# Patient Record
Sex: Male | Born: 1990 | Race: White | Hispanic: No | Marital: Single | Smoking: Current some day smoker
Health system: Southern US, Community
[De-identification: ages and names within clinical notes are randomized; demographics above are authoritative.]

## PROBLEM LIST (undated history)

## (undated) HISTORY — PX: TONSILLECTOMY: SUR1361

---
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
## 2011-02-10 LAB — POCT I-STAT, CHEM 8
Calcium, Ion: 1.09 — ABNORMAL LOW
Glucose, Bld: 114 — ABNORMAL HIGH
HCT: 39
Hemoglobin: 13.3

## 2011-02-10 LAB — CBC
RBC: 4.66
WBC: 7.9

## 2011-02-10 LAB — COMPREHENSIVE METABOLIC PANEL
ALT: 15
AST: 18
CO2: 24
Chloride: 107
Sodium: 138
Total Bilirubin: 0.6

## 2011-02-10 LAB — RAPID URINE DRUG SCREEN, HOSP PERFORMED
Cocaine: NOT DETECTED
Tetrahydrocannabinol: NOT DETECTED

## 2011-02-10 LAB — ETHANOL: Alcohol, Ethyl (B): <5

## 2011-02-10 LAB — ACETAMINOPHEN LEVEL: Acetaminophen (Tylenol), Serum: <10 — ABNORMAL LOW

## 2011-05-06 ENCOUNTER — Encounter: Payer: Self-pay | Admitting: *Deleted

## 2011-05-06 ENCOUNTER — Emergency Department (HOSPITAL_COMMUNITY)
Admission: EM | Admit: 2011-05-06 | Discharge: 2011-05-06 | Disposition: A | Discharge status: Home / Self Care | Payer: Federal, State, Local not specified - PPO | Attending: Emergency Medicine | Admitting: Emergency Medicine

## 2011-05-06 DIAGNOSIS — F329 Major depressive disorder, single episode, unspecified: Secondary | ICD-10-CM | POA: Insufficient documentation

## 2011-05-06 DIAGNOSIS — IMO0002 Reserved for concepts with insufficient information to code with codable children: Secondary | ICD-10-CM | POA: Insufficient documentation

## 2011-05-06 DIAGNOSIS — F1910 Other psychoactive substance abuse, uncomplicated: Secondary | ICD-10-CM | POA: Insufficient documentation

## 2011-05-06 DIAGNOSIS — F3289 Other specified depressive episodes: Secondary | ICD-10-CM | POA: Insufficient documentation

## 2011-05-06 DIAGNOSIS — R45851 Suicidal ideations: Secondary | ICD-10-CM | POA: Insufficient documentation

## 2011-05-06 DIAGNOSIS — R443 Hallucinations, unspecified: Secondary | ICD-10-CM | POA: Insufficient documentation

## 2011-05-06 LAB — CBC
HCT: 40.5 % (ref 39.0–52.0)
RDW: 12.8 % (ref 11.5–15.5)
WBC: 8.1 10*3/uL (ref 4.0–10.5)

## 2011-05-06 LAB — URINALYSIS, ROUTINE W REFLEX MICROSCOPIC
Bilirubin Urine: NEGATIVE
Ketones, ur: NEGATIVE mg/dL
Nitrite: NEGATIVE
Urobilinogen, UA: 0.2 mg/dL (ref 0.0–1.0)
pH: 5.5 (ref 5.0–8.0)

## 2011-05-06 LAB — RAPID URINE DRUG SCREEN, HOSP PERFORMED
Barbiturates: NOT DETECTED
Benzodiazepines: NOT DETECTED
Tetrahydrocannabinol: POSITIVE — AB

## 2011-05-06 LAB — COMPREHENSIVE METABOLIC PANEL
ALT: 13 U/L (ref 0–53)
AST: 15 U/L (ref 0–37)
Albumin: 4.1 g/dL (ref 3.5–5.2)
Alkaline Phosphatase: 65 U/L (ref 39–117)
BUN: 7 mg/dL (ref 6–23)
Chloride: 105 mEq/L (ref 96–112)
Potassium: 4 mEq/L (ref 3.5–5.1)
Sodium: 136 mEq/L (ref 135–145)
Total Bilirubin: 0.2 mg/dL — ABNORMAL LOW (ref 0.3–1.2)

## 2011-05-06 LAB — ETHANOL: Alcohol, Ethyl (B): <11 mg/dL (ref 0–11)

## 2011-05-06 MED ORDER — LORAZEPAM 1 MG PO TABS: 1.0000 mg | ORAL_TABLET | Freq: Three times a day (TID) | ORAL | Status: DC | PRN

## 2011-05-06 MED ORDER — ONDANSETRON HCL 8 MG PO TABS: 4.0000 mg | ORAL_TABLET | Freq: Three times a day (TID) | ORAL | Status: DC | PRN

## 2011-05-06 MED ORDER — ZOLPIDEM TARTRATE 5 MG PO TABS: 5.0000 mg | ORAL_TABLET | Freq: Every evening | ORAL | Status: DC | PRN

## 2011-05-06 MED ORDER — ALUM & MAG HYDROXIDE-SIMETH 200-200-20 MG/5ML PO SUSP: 30.0000 mL | ORAL | Status: DC | PRN

## 2011-05-06 MED ORDER — IBUPROFEN 200 MG PO TABS: 600.0000 mg | ORAL_TABLET | Freq: Three times a day (TID) | ORAL | Status: DC | PRN

## 2011-05-06 MED ORDER — ACETAMINOPHEN 325 MG PO TABS: 650.0000 mg | ORAL_TABLET | ORAL | Status: DC | PRN

## 2011-05-06 NOTE — ED Notes (Signed)
Per mom, " patient is not happy, not worth living, patient put a rope on the tree."  Per patient, " he denies any thoughts of suicide or attempt."  Patient states that his mom kicked him out a week ago and he has not had a place to stay.  Patient states, " I am upset because mom kicked me out and I have no where to go."

## 2011-05-06 NOTE — ED Notes (Signed)
Pt denies suicidal ideation. Mother and pt seem to have home issues. Pt states his mother made him move out recently. Mother is here at bedside and seems to make pt anxious. ACT team at bedside.

## 2011-05-06 NOTE — BH Assessment (Signed)
Assessment Note   Leonard Day is an 20 y.o. male. Pt presents to [facility] with mother and little sister at bedside. Patient himself to the ED today after promising his mother he would get a evaluation for "drugs and mental issues". Pt's mother is adamant that her son is suicidal. She explains that last week he hung a rope from a tree in her back yard. She further explains that he sent her "suicidal related" text messages 3-4 days ago. Pt denies suicidal ideations today. He has no plan, intent, and/or means.. He sts, "I am only here because my mother kicked me out ..I refused to go to a rehab program..so what if I smoke a little K2 and THC". Pt has no history of suicide attempts. He does admit to thoughts in the past but sts, "I am to chicken to hurt myself". Pt is able to contract for safety. Patient denies HI and AVH. He admits to regular use of K2 and THC.   Mother expresses her concern for patients anger issues, drug use, verbal abuse toward her, and lack of respect for her home. Writer informed pt's mother that patient could not be held for behaviors listed above. She was also aware that patient could not be committed for suicidal comments or gestures that happened days ago. Mom expressed her understanding for the terms of when and how a person could be held under commitment.   Patient offered but refused referrals. Pt was offered therapist and psychiatrist. Writer also discussed with patient information about various substance abuse programs; patient denied wanting substance abuse treatment at any capacity.  Discussed case with EDP; he agreed to discharge patient home due to no criteria for commitment. The EDP also discussed this information with patients mother.   Melynda Ripple, MS (Assessment Counselor)  Axis I: Mood Disorder NOS, Cannabis Dependence, other substance (unk) abuse Axis II: Deferred Axis III: History reviewed. No pertinent past medical history. Axis IV: economic problems,  educational problems, housing problems, occupational problems, other psychosocial or environmental problems, problems related to legal system/crime, problems related to social environment, problems with access to health care services and problems with primary support group Axis V: 61-70 mild symptoms  Past Medical History: History reviewed. No pertinent past medical history.  Past Surgical History  Procedure Date  . Vascular surgery     Family History: History reviewed. No pertinent family history.  Social History:  reports that he has been smoking Cigarettes.  He does not have any smokeless tobacco history on file. He reports that he drinks alcohol. He reports that he uses illicit drugs (Other-see comments, Marijuana, and Ketamine).  Additional Social History:  Alcohol / Drug Use Pain Medications: patient denies Prescriptions: patient denies Over the Counter: patient denies Allergies: No Known Allergies  Home Medications:  Medications Prior to Admission  Medication Dose Route Frequency Provider Last Rate Last Dose  . acetaminophen (TYLENOL) tablet 650 mg  650 mg Oral Q4H PRN Dayton Bailiff, MD      . alum & mag hydroxide-simeth (MAALOX/MYLANTA) 200-200-20 MG/5ML suspension 30 mL  30 mL Oral PRN Dayton Bailiff, MD      . ibuprofen (ADVIL,MOTRIN) tablet 600 mg  600 mg Oral Q8H PRN Dayton Bailiff, MD      . LORazepam (ATIVAN) tablet 1 mg  1 mg Oral Q8H PRN Dayton Bailiff, MD      . ondansetron [facility]) tablet 4 mg  4 mg Oral Q8H PRN Dayton Bailiff, MD      . zolpidem (  AMBIEN) tablet 5 mg  5 mg Oral QHS PRN Dayton Bailiff, MD       No current outpatient prescriptions on file as of 05/06/2011.    OB/GYN Status:  No LMP for male patient.  General Assessment Data Location of Assessment: [facility] ED ACT Assessment: Yes Living Arrangements: Other (Comment) (kicked out of home 1wk ago; normally dad,sister, mom) Can pt return to current living arrangement?: No (kicked out of home 1 week ago) Admission Status:  Voluntary Is patient capable of signing voluntary admission?: Yes Transfer from: Acute Hospital Referral Source: Self/Family/Friend  Education Status Is patient currently in school?: No (sts he will be in school in the Spring)  Risk to self Suicidal Ideation: No Suicidal Intent: No Is patient at risk for suicide?: No Suicidal Plan?: No Access to Means: No What has been your use of drugs/alcohol within the last 12 months?:  (K2, THC,occas. alcohol use) Previous Attempts/Gestures: No (pt reports thoughts only no attempts/gestures) How many times?:  (n/a) Other Self Harm Risks:  (n/a) Triggers for Past Attempts:  (thoughts only: thoughts triggered by homelessness) Intentional Self Injurious Behavior: None Family Suicide History: No Recent stressful life event(s): Turmoil (Comment);Other (Comment);Conflict (Comment) (kicked out of parents home 1wk ago, no money, no clothes,etc) Persecutory voices/beliefs?: No Depression: No Depression Symptoms:  (none reported) Substance abuse history and/or treatment for substance abuse?:  (no history of treatment) Suicide prevention information given to non-admitted patients: Not applicable  Risk to Others Homicidal Ideation: No Thoughts of Harm to Others: No Current Homicidal Intent: No Current Homicidal Plan: No Access to Homicidal Means: No Identified Victim:  (n/a) History of harm to others?: No Assessment of Violence: None Noted (calm and cooperative) Violent Behavior Description:  (no history of violent behaviors) Does patient have access to weapons?: No Criminal Charges Pending?: Yes Describe Pending Criminal Charges:  (currently on probation felony possesion charge) Court Date:  (pt sts that he has court 3x's this month)  Psychosis Hallucinations: None noted Delusions: None noted  Mental Status Report Appear/Hygiene: Other (Comment);Disheveled Eye Contact: Good Motor Activity: Unremarkable Speech:  (normal) Level of  Consciousness: Alert Mood: Anxious (pt anxious to leave; doesn't think he needs to be here) Affect: Irritable (appropriate) Anxiety Level: None Judgement: Unimpaired Orientation: Person;Place;Time;Situation Obsessive Compulsive Thoughts/Behaviors: None  Cognitive Functioning Concentration: Normal Memory: Recent Intact;Remote Intact IQ: Average Insight: Good Impulse Control: Good Appetite: Poor Weight Loss:  (pt denies) Weight Gain:  (pt denies) Sleep: No Change Total Hours of Sleep:  (varies..pt sts he tries to get at least 10 hrs per night) Vegetative Symptoms: None  Prior Inpatient Therapy Prior Inpatient Therapy: No Prior Therapy Dates:  (n/a) Prior Therapy Facilty/Provider(s):  (n/a) Reason for Treatment:  (n/a)  Prior Outpatient Therapy Prior Outpatient Therapy: No Prior Therapy Dates:  (n/a) Prior Therapy Facilty/Provider(s):  (n/a) Reason for Treatment:  (n/a)  ADL Screening (condition at time of admission) Patient's cognitive ability adequate to safely complete daily activities?: Yes Patient able to express need for assistance with ADLs?: Yes Independently performs ADLs?: Yes Weakness of Legs: None Weakness of Arms/Hands: None  Home Assistive Devices/Equipment Home Assistive Devices/Equipment: None    Abuse/Neglect Assessment (Assessment to be complete while patient is alone) Physical Abuse: Denies Verbal Abuse: Denies Sexual Abuse: Denies Exploitation of patient/patient's resources: Denies Values / Beliefs Cultural Requests During Hospitalization: None Spiritual Requests During Hospitalization: None     Nutrition Screen Diet: Regular  Additional Information 1:1 In Past 12 Months?: No CIRT Risk: No Elopement Risk: No Does  patient have medical clearance?: Yes     Disposition:  Pt offered and refused all treatment. Pt refused to take any referrals stating, "I just want to get out of here"   On Site Evaluation by:   Reviewed with Physician:       Melynda Ripple [facility] 05/06/2011 10:01 PM

## 2011-05-06 NOTE — ED Provider Notes (Signed)
History     CSN: [id number]  Arrival date & time 05/06/11  1732   First MD Initiated Contact with Patient 05/06/11 1907      Chief Complaint  Patient presents with  . Suicidal    (Consider location/radiation/quality/duration/timing/severity/associated sxs/prior treatment) HPI Comments: Per mom, " patient is not happy, not worth living, patient put a rope on the tree."  Per patient, " he denies any thoughts of suicide or attempt."  Patient states that his mom kicked him out a week ago and he has not had a place to stay.  Patient states, " I am upset because mom kicked me out and I have no where to go."  Patient is a 20 y.o. male presenting with mental health disorder. The history is provided by the patient. No language interpreter was used.  Mental Health Problem The primary symptoms do not include dysphoric mood, delusions or hallucinations.  The onset of the illness is precipitated by a stressful event and drug abuse. The degree of incapacity that he is experiencing as a consequence of his illness is moderate. Additional symptoms of the illness include agitation. Additional symptoms of the illness do not include no anhedonia, no insomnia, no appetite change, no fatigue, no headaches or no abdominal pain. He admits to suicidal ideas. He does have a plan to commit suicide. He contemplates harming himself. He has not already injured self. He does not contemplate injuring another person. He has not already  injured another person.    History reviewed. No pertinent past medical history.  Past Surgical History  Procedure Date  . Vascular surgery     History reviewed. No pertinent family history.  History  Substance Use Topics  . Smoking status: Current Some Day Smoker  . Smokeless tobacco: Not on file  . Alcohol Use: Yes      Review of Systems  Constitutional: Negative for fever, activity change, appetite change and fatigue.  HENT: Negative for congestion, sore throat,  rhinorrhea, neck pain and neck stiffness.   Respiratory: Negative for cough and shortness of breath.   Cardiovascular: Negative for chest pain and palpitations.  Gastrointestinal: Negative for nausea, vomiting and abdominal pain.  Genitourinary: Negative for dysuria, urgency, frequency and flank pain.  Neurological: Negative for dizziness, weakness, light-headedness, numbness and headaches.  Psychiatric/Behavioral: Positive for suicidal ideas, behavioral problems and agitation. Negative for hallucinations and dysphoric mood. The patient is not nervous/anxious and does not have insomnia.   All other systems reviewed and are negative.    Allergies  Review of patient's allergies indicates no known allergies.  Home Medications  No current outpatient prescriptions on file.  BP 159/87  Pulse 97  Temp(Src) 98.3 F (36.8 C) (Oral)  Resp 16  SpO2 98%  Physical Exam  Nursing note and vitals reviewed. Constitutional: He is oriented to person, place, and time. He appears well-developed and well-nourished. No distress.  HENT:  Head: Normocephalic and atraumatic.  Mouth/Throat: Oropharynx is clear and moist. No oropharyngeal exudate.  Eyes: Conjunctivae and EOM are normal. Pupils are equal, round, and reactive to light.  Neck: Normal range of motion. Neck supple.  Cardiovascular: Normal rate, regular rhythm, normal heart sounds and intact distal pulses.  Exam reveals no gallop and no friction rub.   No murmur heard. Pulmonary/Chest: Effort normal and breath sounds normal. No respiratory distress.  Abdominal: Soft. Bowel sounds are normal. There is no tenderness.  Musculoskeletal: Normal range of motion. He exhibits no tenderness.  Neurological: He is alert and oriented  to person, place, and time. No cranial nerve deficit.  Skin: Skin is warm and dry. No rash noted.  Psychiatric: He is agitated and withdrawn. He exhibits a depressed mood. He expresses suicidal ideation. He expresses no  homicidal ideation.    ED Course  Procedures (including critical care time)  Labs Reviewed  URINE RAPID DRUG SCREEN (HOSP PERFORMED) - Abnormal; Notable for the following:    Tetrahydrocannabinol POSITIVE (*)    All other components within normal limits  CBC  URINALYSIS, ROUTINE W REFLEX MICROSCOPIC  COMPREHENSIVE METABOLIC PANEL  ETHANOL   No results found.   No diagnosis found.    MDM  Depression and increased agitataion. Made suicide gestures and has been using illicit drugs.  Medical screening labs and ACT team consult.  Psych holding orders placed.  Will dispo per psych recs.        Dayton Bailiff, MD 05/06/11 2002

## 2013-11-06 ENCOUNTER — Emergency Department (HOSPITAL_COMMUNITY)
Admission: EM | Admit: 2013-11-06 | Discharge: 2013-11-06 | Disposition: A | Discharge status: Home / Self Care | Payer: Federal, State, Local not specified - PPO | Source: Home / Self Care | Attending: Family Medicine | Admitting: Family Medicine

## 2013-11-06 ENCOUNTER — Encounter (HOSPITAL_COMMUNITY): Payer: Self-pay | Admitting: Emergency Medicine

## 2013-11-06 DIAGNOSIS — J358 Other chronic diseases of tonsils and adenoids: Secondary | ICD-10-CM

## 2013-11-06 DIAGNOSIS — R220 Localized swelling, mass and lump, head: Secondary | ICD-10-CM

## 2013-11-06 DIAGNOSIS — J029 Acute pharyngitis, unspecified: Secondary | ICD-10-CM

## 2013-11-06 DIAGNOSIS — R221 Localized swelling, mass and lump, neck: Secondary | ICD-10-CM

## 2013-11-06 LAB — POCT RAPID STREP A: STREPTOCOCCUS, GROUP A SCREEN (DIRECT): NEGATIVE

## 2013-11-06 MED ORDER — FLUTICASONE PROPIONATE 50 MCG/ACT NA SUSP: 2.0000 | Freq: Every day | NASAL | Status: DC

## 2013-11-06 NOTE — Discharge Instructions (Signed)
Thank you for coming in today. [facility]  Let me know if you are having a problem being seen over there.  Call or go to the emergency room if you get worse, have trouble breathing, have chest pains, or palpitations.   Tonsillitis Tonsillitis is an infection of the throat that causes the tonsils to become red, tender, and swollen. Tonsils are collections of lymphoid tissue at the back of the throat. Each tonsil has crevices (crypts). Tonsils help fight nose and throat infections and keep infection from spreading to other parts of the body for the first 18 months of life.  CAUSES Sudden (acute) tonsillitis is usually caused by infection with streptococcal bacteria. Long-lasting (chronic) tonsillitis occurs when the crypts of the tonsils become filled with pieces of food and bacteria, which makes it easy for the tonsils to become repeatedly infected. SYMPTOMS  Symptoms of tonsillitis include:  A sore throat, with possible difficulty swallowing.  White patches on the tonsils.  Fever.  Tiredness.  New episodes of snoring during sleep, when you did not snore before.  Small, foul-smelling, yellowish-white pieces of material (tonsilloliths) that you occasionally cough up or spit out. The tonsilloliths can also cause you to have bad breath. DIAGNOSIS Tonsillitis can be diagnosed through a physical exam. Diagnosis can be confirmed with the results of lab tests, including a throat culture. TREATMENT  The goals of tonsillitis treatment include the reduction of the severity and duration of symptoms and prevention of associated conditions. Symptoms of tonsillitis can be improved with the use of steroids to reduce the swelling. Tonsillitis caused by bacteria can be treated with antibiotic medicines. Usually, treatment with antibiotic medicines is started before the cause of the tonsillitis is known. However, if it is determined that the cause is not bacterial, antibiotic medicines  will not treat the tonsillitis. If attacks of tonsillitis are severe and frequent, your health care provider may recommend surgery to remove the tonsils (tonsillectomy). HOME CARE INSTRUCTIONS   Rest as much as possible and get plenty of sleep.  Drink plenty of fluids. While the throat is very sore, eat soft foods or liquids, such as sherbet, soups, or instant breakfast drinks.  Eat frozen ice pops.  Gargle with a warm or cold liquid to help soothe the throat. Mix 1/4 teaspoon of salt and 1/4 teaspoon of baking soda in in 8 oz of water. SEEK MEDICAL CARE IF:   Large, tender lumps develop in your neck.  A rash develops.  A green, yellow-brown, or bloody substance is coughed up.  You are unable to swallow liquids or food for 24 hours.  You notice that only one of the tonsils is swollen. SEEK IMMEDIATE MEDICAL CARE IF:   You develop any new symptoms such as vomiting, severe headache, stiff neck, chest pain, or trouble breathing or swallowing.  You have severe throat pain along with drooling or voice changes.  You have severe pain, unrelieved with recommended medications.  You are unable to fully open the mouth.  You develop redness, swelling, or severe pain anywhere in the neck.  You have a fever. MAKE SURE YOU:   Understand these instructions.  Will watch your condition.  Will get help right away if you are not doing well or get worse. Document Released: 02/08/2005 Document Revised: 05/06/2013 Document Reviewed: 10/18/2012 [facility] Patient Information 2015 [place] [place]. This information is not intended to replace advice given to you by your health care provider. Make sure you discuss any questions you have  with your health care provider. ° °

## 2013-11-06 NOTE — ED Notes (Signed)
C/o sore throat  States [facility] States it is hard to swallow States girlfriend seen something inside his throat but unaware of what it is

## 2013-11-06 NOTE — ED Provider Notes (Signed)
Leonard Day Dowell is a 23 y.o. male who presents to Urgent Care today for tonsillar mass. Patient notes a mass on his right tonsil is been present for several months. He notes an itchy sensation to his throat. He notes that he snores loudly. He denies any fevers or chills nausea vomiting or diarrhea. No night sweats or weight loss.   History reviewed. No pertinent past medical history. History  Substance Use Topics  . Smoking status: Current Some Day Smoker    Types: Cigarettes  . Smokeless tobacco: Not on file  . Alcohol Use: Yes   ROS as above Medications: No current facility-administered medications for this encounter.   Current Outpatient Prescriptions  Medication Sig Dispense Refill  . fluticasone (FLONASE) 50 MCG/ACT nasal spray Place 2 sprays into both nostrils daily.  16 g  2    Exam:  BP 142/61  Pulse 82  Temp(Src) 98 F (36.7 C) (Oral)  Resp 14  SpO2 100% Gen: Well NAD HEENT: EOMI,  MMM papular less than 1 cm mass on the right tonsil. Tonsils are swollen bilaterally. No significant exudate. Cobblestoning in the posterior pharynx is present. No significant cervical lymphadenopathy present Lungs: Normal work of breathing. CTABL Heart: RRR no MRG Abd: NABS, Soft. NT, ND Exts: Brisk capillary refill, warm and well perfused.   Results for orders placed during the hospital encounter of 11/06/13 (from the past 24 hour(s))  POCT RAPID STREP A ([facility] URG CARE ONLY)     Status: None   Collection Time    11/06/13  5:51 PM      Result Value Ref Range   Streptococcus, Group A Screen (Direct) NEGATIVE  NEGATIVE   No results found.  Assessment and Plan: 23 y.o. male with right tonsillar mass. Unclear etiology. [facility]. Flonase for postnasal drip.  Discussed warning signs or symptoms. Please see discharge instructions. Patient expresses understanding.    Rodolph BongEvan S Corey, MD 11/06/13 [phone]

## 2013-11-08 LAB — CULTURE, GROUP A STREP

## 2014-05-05 ENCOUNTER — Encounter (HOSPITAL_COMMUNITY): Payer: Self-pay | Admitting: *Deleted

## 2014-05-05 ENCOUNTER — Emergency Department (HOSPITAL_COMMUNITY)
Admission: EM | Admit: 2014-05-05 | Discharge: 2014-05-05 | Disposition: A | Discharge status: Home / Self Care | Payer: Federal, State, Local not specified - PPO | Source: Home / Self Care | Attending: Family Medicine | Admitting: Family Medicine

## 2014-05-05 ENCOUNTER — Emergency Department (INDEPENDENT_AMBULATORY_CARE_PROVIDER_SITE_OTHER): Payer: Federal, State, Local not specified - PPO

## 2014-05-05 DIAGNOSIS — S93412A Sprain of calcaneofibular ligament of left ankle, initial encounter: Secondary | ICD-10-CM

## 2014-05-05 MED ORDER — HYDROCODONE-ACETAMINOPHEN 5-325 MG PO TABS: 1.0000 | ORAL_TABLET | Freq: Four times a day (QID) | ORAL | Status: DC | PRN

## 2014-05-05 NOTE — Discharge Instructions (Signed)
Wear ankle support as needed for comfort, activity as tolerated. advil or pain medicine as needed, ice for 2 days then soak in warm water twice daily, see orthopedist if further problems.

## 2014-05-05 NOTE — ED Notes (Signed)
Pt  Reports     He  Twisted  His  l  Ankle  Last  Pm         When  He  Felled  Down  Some  Steps         He  Has  Pain  And   Swelling  Present      He  Has  Tenderness  On palpation

## 2014-05-05 NOTE — ED Provider Notes (Signed)
CSN: [id number]     Arrival date & time 05/05/14  [id number] History   First MD Initiated Contact with Patient 05/05/14 1013     Chief Complaint  Patient presents with  . Ankle Pain   (Consider location/radiation/quality/duration/timing/severity/associated sxs/prior Treatment) Patient is a 23 y.o. male presenting with ankle pain. The history is provided by the patient and a parent.  Ankle Pain Location:  Ankle Time since incident:  1 day Injury: yes   Mechanism of injury comment:  Twisted after jumping down 3 stairs . Ankle location:  L ankle Pain details:    Severity:  Moderate   Progression:  Worsening Chronicity:  New Dislocation: no   Relieved by:  None tried Worsened by:  Nothing tried Ineffective treatments:  None tried Associated symptoms: decreased ROM and swelling     History reviewed. No pertinent past medical history. Past Surgical History  Procedure Laterality Date  . Vascular surgery     History reviewed. No pertinent family history. History  Substance Use Topics  . Smoking status: Current Some Day Smoker    Types: Cigarettes  . Smokeless tobacco: Not on file  . Alcohol Use: Yes    Review of Systems  Constitutional: Negative.   Musculoskeletal: Positive for joint swelling and gait problem.  Skin: Negative.     Allergies  Review of patient's allergies indicates no known allergies.  Home Medications   Prior to Admission medications   Medication Sig Start Date End Date Taking? Authorizing Provider  fluticasone (FLONASE) 50 MCG/ACT nasal spray Place 2 sprays into both nostrils daily. 11/06/13   Rodolph BongEvan S Corey, MD  HYDROcodone-acetaminophen (NORCO/VICODIN) 5-325 MG per tablet Take 1 tablet by mouth every 6 (six) hours as needed for moderate pain or severe pain. 05/05/14   Linna HoffJames D Shermaine Rivet, MD   BP 127/89 mmHg  Pulse 97  Temp(Src) 98.5 F (36.9 C) (Oral)  Resp 16  SpO2 97% Physical Exam  Constitutional: He is oriented to person, place, and time. He appears  well-developed and well-nourished.  Musculoskeletal: He exhibits tenderness.       Left ankle: He exhibits decreased range of motion, swelling and deformity. He exhibits normal pulse. Tenderness. Lateral malleolus, AITFL and posterior TFL tenderness found. No head of 5th metatarsal and no proximal fibula tenderness found. Achilles tendon normal.  Neurological: He is alert and oriented to person, place, and time.  Skin: Skin is warm and dry.  Nursing note and vitals reviewed.   ED Course  Procedures (including critical care time) Labs Review Labs Reviewed - No data to display  Imaging Review Dg Ankle Complete Left  05/05/2014   CLINICAL DATA:  Jumped down 3 steps last night, struck a rock and foot went out, felt something crack, limited range of motion, lateral pain, initial encounter  EXAM: LEFT ANKLE COMPLETE - 3+ VIEW  COMPARISON:  11/21/2007  FINDINGS: Soft tissue swelling particularly laterally and anteriorly.  Osseous mineralization normal.  Joint spaces preserved.  Tiny calcific density is seen laterally, slightly rounded and atypical in appearance.  However, an apparent cortical defect is identified at the tip of the lateral malleolus raising question of an avulsion fracture.  No additional fracture, dislocation or bone destruction.  IMPRESSION: Marked soft tissue swelling especially anteriorly and laterally.  Cortical defect at tip of lateral malleolus with a small adjacent atypical calcific density suspicious for an avulsion fracture.   Electronically Signed   By: Ulyses SouthwardMark  Boles M.D.   On: 05/05/2014 11:22    X-rays reviewed  and report per radiologist.  MDM   1. Sprain of ankle, calcaneofibular ligament, left, initial encounter        Linna HoffJames D Shavawn Stobaugh, MD 05/05/14 1151

## 2014-08-07 ENCOUNTER — Ambulatory Visit (INDEPENDENT_AMBULATORY_CARE_PROVIDER_SITE_OTHER): Payer: Federal, State, Local not specified - PPO | Admitting: Family Medicine

## 2014-08-07 VITALS — BP 144/64 | HR 94 | Temp 98.5°F | Resp 18 | Ht 73.0 in | Wt 263.2 lb

## 2014-08-07 DIAGNOSIS — J029 Acute pharyngitis, unspecified: Secondary | ICD-10-CM | POA: Diagnosis not present

## 2014-08-07 DIAGNOSIS — R059 Cough, unspecified: Secondary | ICD-10-CM

## 2014-08-07 DIAGNOSIS — R52 Pain, unspecified: Secondary | ICD-10-CM

## 2014-08-07 DIAGNOSIS — R509 Fever, unspecified: Secondary | ICD-10-CM | POA: Diagnosis not present

## 2014-08-07 DIAGNOSIS — R05 Cough: Secondary | ICD-10-CM | POA: Diagnosis not present

## 2014-08-07 LAB — POCT INFLUENZA A/B
INFLUENZA A, POC: NEGATIVE
INFLUENZA B, POC: NEGATIVE

## 2014-08-07 LAB — POCT RAPID STREP A (OFFICE): Rapid Strep A Screen: NEGATIVE

## 2014-08-07 NOTE — Progress Notes (Signed)
This chart was scribed for Leonard StaggersJeffrey Voncille Simm, MD by Tonye RoyaltyJoshua Day, ED Scribe. This patient was seen in room 1 and the patient's care was started at 4:04 PM.   Subjective:    Patient ID: Leonard Day, male    DOB: Oct 24, 1990, 24 y.o.   MRN: [id number]  Chief Complaint  Patient presents with  . Sore Throat    x 3 days  . Back Pain  . Leg Pain  . Fatigue  . Cough    Productive, causes headache  . Nasal Congestion  . Headache    Positive for photophobia and phonophobia    HPI  HPI Comments: Leonard Day is a 24 y.o. male who presents to the [facility] complaining of cough and body aches with onset 3 days ago. He states it began with cough, headaches, fatigue, and body aches. He reports associated congestion, dizziness with moving quickly, photophobia, subjective fever, sore throat, and chills. He states he ate well last night but eating made him feel sick yesterday. He states he urinated twice today and had clear urine. He notes a sick contact who was evaluated at the ED; patient does not know the diagnosis. He did not get a flu shot this year. He has used Ibuprofen and Nyquil for his symptoms. He states he just started a new job at [employer]. He denies syncope.    There are no active problems to display for this patient.  History reviewed. No pertinent past medical history. Past Surgical History  Procedure Laterality Date  . Vascular surgery     No Known Allergies Prior to Admission medications   Medication Sig Start Date End Date Taking? Authorizing Provider  fluticasone (FLONASE) 50 MCG/ACT nasal spray Place 2 sprays into both nostrils daily. Patient not taking: Reported on 08/07/2014 11/06/13   Rodolph BongEvan S Corey, MD  HYDROcodone-acetaminophen (NORCO/VICODIN) 5-325 MG per tablet Take 1 tablet by mouth every 6 (six) hours as needed for moderate pain or severe pain. Patient not taking: Reported on 08/07/2014 05/05/14   Linna HoffJames D Kindl, MD   History   Social  History  . Marital Status: Single    Spouse Name: N/A  . Number of Children: N/A  . Years of Education: N/A   Occupational History  . Not on file.   Social History Main Topics  . Smoking status: Current Some Day Smoker    Types: Cigarettes  . Smokeless tobacco: Not on file  . Alcohol Use: Yes  . Drug Use: Yes    Special: Other-see comments, Marijuana, Ketamine  . Sexual Activity: Not on file   Other Topics Concern  . Not on file   Social History Narrative      Review of Systems  Constitutional: Positive for fever, chills, appetite change and fatigue.  HENT: Positive for congestion, sore throat and trouble swallowing.   Eyes: Positive for photophobia.  Respiratory: Positive for cough.   Musculoskeletal: Positive for myalgias.  Neurological: Positive for dizziness and headaches. Negative for syncope.       Objective:   Physical Exam  Constitutional: He is oriented to person, place, and time. He appears well-developed and well-nourished.  HENT:  Head: Normocephalic and atraumatic.  Erythema of posterior pharynx but no exudate No tonsillar hypertrophy Slight frontal sinus pressure or discomfort on exam, but maxillary sinuses are nontender  Eyes: Conjunctivae and EOM are normal. Pupils are equal, round, and reactive to light.  Neck: Normal range of motion. Neck supple. No JVD  present. Carotid bruit is not present. No Brudzinski's sign noted.  Tender along AC node area but no palpable lymph nodes  Cardiovascular: Normal rate, regular rhythm and normal heart sounds.   No murmur heard. Pulmonary/Chest: Effort normal and breath sounds normal. He has no rales.  Abdominal: Soft. He exhibits no distension. There is no tenderness.  Musculoskeletal: Normal range of motion. He exhibits no edema.  Neurological: He is alert and oriented to person, place, and time.  Skin: Skin is warm and dry.  Psychiatric: He has a normal mood and affect.  Nursing note and vitals  reviewed.   Filed Vitals:   08/07/14 1533  BP: 144/64  Pulse: 94  Temp: 98.5 F (36.9 C)  TempSrc: Oral  Resp: 18  Height: 6\' 1"  (1.854 m)  Weight: 263 lb 3.2 oz (119.387 kg)  SpO2: 98%        Assessment & Plan:   Leonard Day is a 24 y.o. male Body aches - Plan: POCT Influenza A/B, POCT rapid strep A  Fever, unspecified fever cause - Plan: POCT Influenza A/B, POCT rapid strep A  Sore throat - Plan: POCT Influenza A/B, POCT rapid strep A, Throat culture ([facility])  Cough - Plan: POCT Influenza A/B, POCT rapid strep A  Suspected viral illness. Afebrile, reassuring exam at present. Sx care with throat lozenges, mucinex if needed - samples given. Out of work note given. But RTC precautions discussed. Throat culture pending, but suspect this will be negative.   No orders of the defined types were placed in this encounter.   Patient Instructions  Saline nasal spray atleast 4 times per day, over the counter mucinex or mucinex DM for cough, sore throat lozenges such as Cepacol as needed, drink plenty of fluids. Return to the clinic or go to the nearest emergency room if any of your symptoms worsen or new symptoms occur.  You should receive a call or letter about your lab results within the next week to 10 days (throat culture).    Upper Respiratory Infection, Adult An upper respiratory infection (URI) is also sometimes known as the common cold. The upper respiratory tract includes the nose, sinuses, throat, trachea, and bronchi. Bronchi are the airways leading to the lungs. Most people improve within 1 week, but symptoms can last up to 2 weeks. A residual cough may last even longer.  CAUSES Many different viruses can infect the tissues lining the upper respiratory tract. The tissues become irritated and inflamed and often become very moist. Mucus production is also common. A cold is contagious. You can easily spread the virus to others by oral contact. This includes kissing,  sharing a glass, coughing, or sneezing. Touching your mouth or nose and then touching a surface, which is then touched by another person, can also spread the virus. SYMPTOMS  Symptoms typically develop 1 to 3 days after you come in contact with a cold virus. Symptoms vary from person to person. They may include:  Runny nose.  Sneezing.  Nasal congestion.  Sinus irritation.  Sore throat.  Loss of voice (laryngitis).  Cough.  Fatigue.  Muscle aches.  Loss of appetite.  Headache.  Low-grade fever. DIAGNOSIS  You might diagnose your own cold based on familiar symptoms, since most people get a cold 2 to 3 times a year. Your caregiver can confirm this based on your exam. Most importantly, your caregiver can check that your symptoms are not due to another disease such as strep throat, sinusitis, pneumonia, asthma, or  epiglottitis. Blood tests, throat tests, and X-rays are not necessary to diagnose a common cold, but they may sometimes be helpful in excluding other more serious diseases. Your caregiver will decide if any further tests are required. RISKS AND COMPLICATIONS  You may be at risk for a more severe case of the common cold if you smoke cigarettes, have chronic heart disease (such as heart failure) or lung disease (such as asthma), or if you have a weakened immune system. The very young and very old are also at risk for more serious infections. Bacterial sinusitis, middle ear infections, and bacterial pneumonia can complicate the common cold. The common cold can worsen asthma and chronic obstructive pulmonary disease (COPD). Sometimes, these complications can require emergency medical care and may be life-threatening. PREVENTION  The best way to protect against getting a cold is to practice good hygiene. Avoid oral or hand contact with people with cold symptoms. Wash your hands often if contact occurs. There is no clear evidence that vitamin C, vitamin E, echinacea, or exercise  reduces the chance of developing a cold. However, it is always recommended to get plenty of rest and practice good nutrition. TREATMENT  Treatment is directed at relieving symptoms. There is no cure. Antibiotics are not effective, because the infection is caused by a virus, not by bacteria. Treatment may include:  Increased fluid intake. Sports drinks offer valuable electrolytes, sugars, and fluids.  Breathing heated mist or steam (vaporizer or shower).  Eating chicken soup or other clear broths, and maintaining good nutrition.  Getting plenty of rest.  Using gargles or lozenges for comfort.  Controlling fevers with ibuprofen or acetaminophen as directed by your caregiver.  Increasing usage of your inhaler if you have asthma. Zinc gel and zinc lozenges, taken in the first 24 hours of the common cold, can shorten the duration and lessen the severity of symptoms. Pain medicines may help with fever, muscle aches, and throat pain. A variety of non-prescription medicines are available to treat congestion and runny nose. Your caregiver can make recommendations and may suggest nasal or lung inhalers for other symptoms.  HOME CARE INSTRUCTIONS   Only take over-the-counter or prescription medicines for pain, discomfort, or fever as directed by your caregiver.  Use a warm mist humidifier or inhale steam from a shower to increase air moisture. This may keep secretions moist and make it easier to breathe.  Drink enough water and fluids to keep your urine clear or pale yellow.  Rest as needed.  Return to work when your temperature has returned to normal or as your caregiver advises. You may need to stay home longer to avoid infecting others. You can also use a face mask and careful hand washing to prevent spread of the virus. SEEK MEDICAL CARE IF:   After the first few days, you feel you are getting worse rather than better.  You need your caregiver's advice about medicines to control  symptoms.  You develop chills, worsening shortness of breath, or brown or red sputum. These may be signs of pneumonia.  You develop yellow or brown nasal discharge or pain in the face, especially when you bend forward. These may be signs of sinusitis.  You develop a fever, swollen neck glands, pain with swallowing, or white areas in the back of your throat. These may be signs of strep throat. SEEK IMMEDIATE MEDICAL CARE IF:   You have a fever.  You develop severe or persistent headache, ear pain, sinus pain, or chest pain.  You develop wheezing, a prolonged cough, cough up blood, or have a change in your usual mucus (if you have chronic lung disease).  You develop sore muscles or a stiff neck. Document Released: 10/25/2000 Document Revised: 07/24/2011 Document Reviewed: 08/06/2013 [facility] Patient Information 2015 [place], [place]. This information is not intended to replace advice given to you by your health care provider. Make sure you discuss any questions you have with your health care provider.     I personally performed the services described in this documentation, which was scribed in my presence. The recorded information has been reviewed and considered, and addended by me as needed.

## 2014-08-07 NOTE — Patient Instructions (Addendum)
Saline nasal spray atleast 4 times per day, over the counter mucinex or mucinex DM for cough, sore throat lozenges such as Cepacol as needed, drink plenty of fluids. Return to the clinic or go to the nearest emergency room if any of your symptoms worsen or new symptoms occur.  You should receive a call or letter about your lab results within the next week to 10 days (throat culture).    Upper Respiratory Infection, Adult An upper respiratory infection (URI) is also sometimes known as the common cold. The upper respiratory tract includes the nose, sinuses, throat, trachea, and bronchi. Bronchi are the airways leading to the lungs. Most people improve within 1 week, but symptoms can last up to 2 weeks. A residual cough may last even longer.  CAUSES Many different viruses can infect the tissues lining the upper respiratory tract. The tissues become irritated and inflamed and often become very moist. Mucus production is also common. A cold is contagious. You can easily spread the virus to others by oral contact. This includes kissing, sharing a glass, coughing, or sneezing. Touching your mouth or nose and then touching a surface, which is then touched by another person, can also spread the virus. SYMPTOMS  Symptoms typically develop 1 to 3 days after you come in contact with a cold virus. Symptoms vary from person to person. They may include:  Runny nose.  Sneezing.  Nasal congestion.  Sinus irritation.  Sore throat.  Loss of voice (laryngitis).  Cough.  Fatigue.  Muscle aches.  Loss of appetite.  Headache.  Low-grade fever. DIAGNOSIS  You might diagnose your own cold based on familiar symptoms, since most people get a cold 2 to 3 times a year. Your caregiver can confirm this based on your exam. Most importantly, your caregiver can check that your symptoms are not due to another disease such as strep throat, sinusitis, pneumonia, asthma, or epiglottitis. Blood tests, throat tests, and  X-rays are not necessary to diagnose a common cold, but they may sometimes be helpful in excluding other more serious diseases. Your caregiver will decide if any further tests are required. RISKS AND COMPLICATIONS  You may be at risk for a more severe case of the common cold if you smoke cigarettes, have chronic heart disease (such as heart failure) or lung disease (such as asthma), or if you have a weakened immune system. The very young and very old are also at risk for more serious infections. Bacterial sinusitis, middle ear infections, and bacterial pneumonia can complicate the common cold. The common cold can worsen asthma and chronic obstructive pulmonary disease (COPD). Sometimes, these complications can require emergency medical care and may be life-threatening. PREVENTION  The best way to protect against getting a cold is to practice good hygiene. Avoid oral or hand contact with people with cold symptoms. Wash your hands often if contact occurs. There is no clear evidence that vitamin C, vitamin E, echinacea, or exercise reduces the chance of developing a cold. However, it is always recommended to get plenty of rest and practice good nutrition. TREATMENT  Treatment is directed at relieving symptoms. There is no cure. Antibiotics are not effective, because the infection is caused by a virus, not by bacteria. Treatment may include:  Increased fluid intake. Sports drinks offer valuable electrolytes, sugars, and fluids.  Breathing heated mist or steam (vaporizer or shower).  Eating chicken soup or other clear broths, and maintaining good nutrition.  Getting plenty of rest.  Using gargles or lozenges for  comfort.  Controlling fevers with ibuprofen or acetaminophen as directed by your caregiver.  Increasing usage of your inhaler if you have asthma. Zinc gel and zinc lozenges, taken in the first 24 hours of the common cold, can shorten the duration and lessen the severity of symptoms. Pain  medicines may help with fever, muscle aches, and throat pain. A variety of non-prescription medicines are available to treat congestion and runny nose. Your caregiver can make recommendations and may suggest nasal or lung inhalers for other symptoms.  HOME CARE INSTRUCTIONS   Only take over-the-counter or prescription medicines for pain, discomfort, or fever as directed by your caregiver.  Use a warm mist humidifier or inhale steam from a shower to increase air moisture. This may keep secretions moist and make it easier to breathe.  Drink enough water and fluids to keep your urine clear or pale yellow.  Rest as needed.  Return to work when your temperature has returned to normal or as your caregiver advises. You may need to stay home longer to avoid infecting others. You can also use a face mask and careful hand washing to prevent spread of the virus. SEEK MEDICAL CARE IF:   After the first few days, you feel you are getting worse rather than better.  You need your caregiver's advice about medicines to control symptoms.  You develop chills, worsening shortness of breath, or brown or red sputum. These may be signs of pneumonia.  You develop yellow or brown nasal discharge or pain in the face, especially when you bend forward. These may be signs of sinusitis.  You develop a fever, swollen neck glands, pain with swallowing, or white areas in the back of your throat. These may be signs of strep throat. SEEK IMMEDIATE MEDICAL CARE IF:   You have a fever.  You develop severe or persistent headache, ear pain, sinus pain, or chest pain.  You develop wheezing, a prolonged cough, cough up blood, or have a change in your usual mucus (if you have chronic lung disease).  You develop sore muscles or a stiff neck. Document Released: 10/25/2000 Document Revised: 07/24/2011 Document Reviewed: 08/06/2013 [facility] Patient Information 2015 [place]. This information is not intended to replace  advice given to you by your health care provider. Make sure you discuss any questions you have with your health care provider.

## 2014-08-09 LAB — CULTURE, GROUP A STREP: ORGANISM ID, BACTERIA: NORMAL

## 2014-11-12 ENCOUNTER — Encounter (HOSPITAL_COMMUNITY): Payer: Self-pay | Admitting: *Deleted

## 2014-11-12 ENCOUNTER — Emergency Department (HOSPITAL_COMMUNITY)
Admission: EM | Admit: 2014-11-12 | Discharge: 2014-11-12 | Disposition: A | Discharge status: Home / Self Care | Payer: Federal, State, Local not specified - PPO | Attending: Emergency Medicine | Admitting: Emergency Medicine

## 2014-11-12 ENCOUNTER — Emergency Department (HOSPITAL_COMMUNITY)
Admission: EM | Admit: 2014-11-12 | Discharge: 2014-11-12 | Disposition: A | Discharge status: Home / Self Care | Payer: Federal, State, Local not specified - PPO | Source: Home / Self Care | Attending: Family Medicine | Admitting: Family Medicine

## 2014-11-12 DIAGNOSIS — Z720 Tobacco use: Secondary | ICD-10-CM | POA: Insufficient documentation

## 2014-11-12 DIAGNOSIS — K529 Noninfective gastroenteritis and colitis, unspecified: Secondary | ICD-10-CM

## 2014-11-12 DIAGNOSIS — Z7951 Long term (current) use of inhaled steroids: Secondary | ICD-10-CM | POA: Diagnosis not present

## 2014-11-12 DIAGNOSIS — R61 Generalized hyperhidrosis: Secondary | ICD-10-CM | POA: Diagnosis not present

## 2014-11-12 DIAGNOSIS — R1084 Generalized abdominal pain: Secondary | ICD-10-CM

## 2014-11-12 DIAGNOSIS — R109 Unspecified abdominal pain: Secondary | ICD-10-CM | POA: Diagnosis present

## 2014-11-12 DIAGNOSIS — R1110 Vomiting, unspecified: Secondary | ICD-10-CM | POA: Diagnosis not present

## 2014-11-12 LAB — CBC WITH DIFFERENTIAL/PLATELET
Basophils Absolute: 0 10*3/uL (ref 0.0–0.1)
Basophils Relative: 0 % (ref 0–1)
EOS PCT: 0 % (ref 0–5)
Eosinophils Absolute: 0 10*3/uL (ref 0.0–0.7)
HCT: 49.5 % (ref 39.0–52.0)
Hemoglobin: 17.9 g/dL — ABNORMAL HIGH (ref 13.0–17.0)
Lymphocytes Relative: 2 % — ABNORMAL LOW (ref 12–46)
Lymphs Abs: 0.4 10*3/uL — ABNORMAL LOW (ref 0.7–4.0)
MCH: 31 pg (ref 26.0–34.0)
MCHC: 36.2 g/dL — AB (ref 30.0–36.0)
MCV: 85.6 fL (ref 78.0–100.0)
MONOS PCT: 6 % (ref 3–12)
Monocytes Absolute: 1.5 10*3/uL — ABNORMAL HIGH (ref 0.1–1.0)
Neutro Abs: 23 10*3/uL — ABNORMAL HIGH (ref 1.7–7.7)
Neutrophils Relative %: 92 % — ABNORMAL HIGH (ref 43–77)
PLATELETS: 272 10*3/uL (ref 150–400)
RBC: 5.78 MIL/uL (ref 4.22–5.81)
RDW: 12.6 % (ref 11.5–15.5)
WBC: 24.9 10*3/uL — AB (ref 4.0–10.5)

## 2014-11-12 LAB — COMPREHENSIVE METABOLIC PANEL
ALBUMIN: 5.2 g/dL — AB (ref 3.5–5.0)
ALT: 28 U/L (ref 17–63)
AST: 31 U/L (ref 15–41)
Alkaline Phosphatase: 62 U/L (ref 38–126)
Anion gap: 14 (ref 5–15)
BILIRUBIN TOTAL: 1.2 mg/dL (ref 0.3–1.2)
BUN: 12 mg/dL (ref 6–20)
CALCIUM: 10 mg/dL (ref 8.9–10.3)
CO2: 18 mmol/L — ABNORMAL LOW (ref 22–32)
CREATININE: 1.15 mg/dL (ref 0.61–1.24)
Chloride: 108 mmol/L (ref 101–111)
GFR calc Af Amer: >60 mL/min (ref >60)
GLUCOSE: 127 mg/dL — AB (ref 65–99)
Potassium: 4 mmol/L (ref 3.5–5.1)
Sodium: 140 mmol/L (ref 135–145)
Total Protein: 8.4 g/dL — ABNORMAL HIGH (ref 6.5–8.1)

## 2014-11-12 LAB — LIPASE, BLOOD: LIPASE: 14 U/L — AB (ref 22–51)

## 2014-11-12 MED ORDER — LORAZEPAM 2 MG/ML IJ SOLN
1.0000 mg | Freq: Once | INTRAMUSCULAR | Status: AC
  Administered 2014-11-12: 1 mg via INTRAVENOUS
  Filled 2014-11-12: qty 1

## 2014-11-12 MED ORDER — SODIUM CHLORIDE 0.9 % IV BOLUS (SEPSIS)
1000.0000 mL | Freq: Once | INTRAVENOUS | Status: AC
  Administered 2014-11-12: 1000 mL via INTRAVENOUS

## 2014-11-12 MED ORDER — ONDANSETRON HCL 4 MG/2ML IJ SOLN
8.0000 mg | Freq: Once | INTRAMUSCULAR | Status: AC
  Administered 2014-11-12: 8 mg via INTRAMUSCULAR

## 2014-11-12 MED ORDER — ONDANSETRON HCL 4 MG/2ML IJ SOLN
4.0000 mg | Freq: Once | INTRAMUSCULAR | Status: AC
  Administered 2014-11-12: 4 mg via INTRAVENOUS
  Filled 2014-11-12: qty 2

## 2014-11-12 MED ORDER — ONDANSETRON HCL 4 MG/2ML IJ SOLN
INTRAMUSCULAR | Status: AC
  Filled 2014-11-12: qty 4

## 2014-11-12 MED ORDER — ONDANSETRON 4 MG PO TBDP: 4.0000 mg | ORAL_TABLET | Freq: Three times a day (TID) | ORAL | Status: DC | PRN

## 2014-11-12 NOTE — ED Notes (Signed)
Pt  Reports    Symptoms  Of      Vomiting  /   Diarrhea          That  Started  This  Am        Pt        Reports  Other  Family members  Have  Similar    Symptoms

## 2014-11-12 NOTE — Discharge Instructions (Signed)
As discussed, your diagnosis tonight was viral gastroenteritis, but with some concern for other occult problems, it is important that you monitor your condition carefully, and do not hesitate to return here if you develop any new, or concerning changes in your condition.

## 2014-11-12 NOTE — ED Notes (Signed)
Pt states abdominal pain and emesis since this am.  Was seen at [facility], but did not pass po challenge after 8 mg IM zofran.  States friends have been sick with same and both were admitted to hospital.

## 2014-11-12 NOTE — ED Notes (Addendum)
Fluid  Challenge         Taken  Well  Initially  Then  Started  Vomiting  Again

## 2014-11-12 NOTE — ED Provider Notes (Signed)
CSN: [id number]     Arrival date & time 11/12/14  1305 History   First MD Initiated Contact with Patient 11/12/14 1339     Chief Complaint  Patient presents with  . Emesis   (Consider location/radiation/quality/duration/timing/severity/associated sxs/prior Treatment) HPI Comments: 24 year old male who developed nausea vomiting and diarrhea approximately 8:30 this morning. He says he has had too numerous to count vomiting episodes and 15-20 diarrhea episodes. He states he seen traces of blood in his emesis. After vomiting for a while he developed abdominal pain in the epigastrium and right upper quadrant. He denied fever. He unequivocally denies anyone else in the family having similar symptoms and his male significant other confirms that.  2 hr later she st that 2 others in the family,parents, was admitted to the hospital for similar sx's recently.   History reviewed. No pertinent past medical history. Past Surgical History  Procedure Laterality Date  . Vascular surgery     Family History  Problem Relation Age of Onset  . Diabetes Maternal Grandfather    History  Substance Use Topics  . Smoking status: Current Some Day Smoker    Types: Cigarettes  . Smokeless tobacco: Not on file  . Alcohol Use: Yes    Review of Systems  Constitutional: Positive for activity change and appetite change. Negative for fever.  HENT: Negative.   Respiratory: Negative for cough and shortness of breath.   Cardiovascular: Negative for chest pain.  Gastrointestinal: Positive for nausea, vomiting, abdominal pain and diarrhea. Negative for constipation.  Genitourinary: Negative.   Musculoskeletal: Negative.   Skin: Negative for rash.    Allergies  Review of patient's allergies indicates no known allergies.  Home Medications   Prior to Admission medications   Medication Sig Start Date End Date Taking? Authorizing Provider  fluticasone (FLONASE) 50 MCG/ACT nasal spray Place 2 sprays into both  nostrils daily. Patient not taking: Reported on 08/07/2014 11/06/13   Rodolph Bong, MD  HYDROcodone-acetaminophen (NORCO/VICODIN) 5-325 MG per tablet Take 1 tablet by mouth every 6 (six) hours as needed for moderate pain or severe pain. Patient not taking: Reported on 08/07/2014 05/05/14   Linna Hoff, MD   Pulse 88  Temp(Src) 98.6 F (37 C) (Oral)  Resp 140 Physical Exam  Constitutional: He is oriented to person, place, and time. He appears well-developed and well-nourished. No distress.  HENT:  Bilateral TMs are normal Oropharynx with very brief look appears to be erythematous and injected. No exudates.  Eyes: Conjunctivae and EOM are normal.  Neck: Normal range of motion. Neck supple.  Cardiovascular: Normal rate, regular rhythm and normal heart sounds.   Apical rate is 80 bpm.  Pulmonary/Chest: Effort normal. No respiratory distress. He has no wheezes. He has no rales.  Abdominal: Soft.  Bowel sounds diminished/hypoactive Greatest amount of tenderness is in the epigastric and right upper quadrant. He has generalized tenderness that includes the right lower quadrant with  equivocal rebound.  Musculoskeletal: He exhibits no edema.  Lymphadenopathy:    He has no cervical adenopathy.  Neurological: He is alert and oriented to person, place, and time.  Skin: Skin is warm and dry.  Psychiatric: He has a normal mood and affect.  Nursing note and vitals reviewed.   ED Course  Procedures (including critical care time) Labs Review Labs Reviewed - No data to display  Imaging Review No results found.   MDM   1. Intractable vomiting with nausea, vomiting of unspecified type   2. Generalized abdominal pain  Patient was administered 8 mg of Zofran at the outset of treatment. Observation over the following hour and a half reveals patient has not stopped vomiting. He has been given ice and water and has vomited the total amount. He will be sent to the emergency department for  persistent, unrelenting vomiting; and IV fluids and other medications that we do not have in the urgent care.    Hayden Rasmussenavid Smaran Gaus, NP 11/12/14 [phone]

## 2014-11-12 NOTE — ED Notes (Signed)
Pt continues to have dry heaves.

## 2014-11-12 NOTE — ED Provider Notes (Signed)
CSN: [id number]     Arrival date & time 11/12/14  1633 History   First MD Initiated Contact with Patient 11/12/14 1724     Chief Complaint  Patient presents with  . Abdominal Pain  . Emesis    HPI  Patient presents with concern of nausea, vomiting, diarrhea. Symptoms began earlier today Patient was initially at urgent care, but after hours of monitoring, fluids, he did not improve substantially, was intolerant of oral intake. He was sent here for evaluation. Prior to today the patient was in his usual state of health. No recent medical changes, food changes, activity changes. Patient has no medical problems, takes no medication regularly. Since onset symptoms of been persistent, fluids, antiemetics, rest. Patient has a pair of family members who have similar illness who returned home from hospitalization yesterday.  Patient smokes  Smoking cessation provided, particularly in light of this patient's evaluation in the ED.    No past medical history on file. Past Surgical History  Procedure Laterality Date  . Tonsillectomy     Family History  Problem Relation Age of Onset  . Diabetes Maternal Grandfather    History  Substance Use Topics  . Smoking status: Current Some Day Smoker -- 0.50 packs/day    Types: Cigarettes  . Smokeless tobacco: Not on file  . Alcohol Use: Yes    Review of Systems  Constitutional:       Per HPI, otherwise negative  HENT:       Per HPI, otherwise negative  Respiratory:       Per HPI, otherwise negative  Cardiovascular:       Per HPI, otherwise negative  Gastrointestinal: Positive for nausea, vomiting, abdominal pain and diarrhea.  Endocrine:       Negative aside from HPI  Genitourinary:       Neg aside from HPI   Musculoskeletal:       Per HPI, otherwise negative  Skin: Negative.   Neurological: Negative for syncope.      Allergies  Review of patient's allergies indicates no known allergies.  Home Medications   Prior to  Admission medications   Medication Sig Start Date End Date Taking? Authorizing Provider  fluticasone (FLONASE) 50 MCG/ACT nasal spray Place 2 sprays into both nostrils daily. Patient not taking: Reported on 08/07/2014 11/06/13   Rodolph BongEvan S Corey, MD  HYDROcodone-acetaminophen (NORCO/VICODIN) 5-325 MG per tablet Take 1 tablet by mouth every 6 (six) hours as needed for moderate pain or severe pain. Patient not taking: Reported on 08/07/2014 05/05/14   Linna HoffJames D Kindl, MD   BP 141/61 mmHg  Pulse 69  Temp(Src) 97.4 F (36.3 C) (Oral)  Resp 18  Ht 6\' 2"  (1.88 m)  Wt 252 lb 4.8 oz (114.443 kg)  BMI 32.38 kg/m2  SpO2 100% Physical Exam  Constitutional: He is oriented to person, place, and time. He appears well-developed and well-nourished. He appears distressed.  HENT:  Head: Normocephalic and atraumatic.  Eyes: Conjunctivae and EOM are normal.  Cardiovascular: Normal rate and regular rhythm.   Pulmonary/Chest: Effort normal. No stridor. No respiratory distress.  Abdominal: He exhibits no distension. There is tenderness.  Diffuse TTP, w/o peritoneal findings  Musculoskeletal: He exhibits no edema.  Neurological: He is alert and oriented to person, place, and time.  Skin: Skin is warm. He is diaphoretic.  Psychiatric: He has a normal mood and affect.  Nursing note and vitals reviewed.   ED Course  Procedures (including critical care time) Labs Review Labs Reviewed  CBC WITH DIFFERENTIAL/PLATELET - Abnormal; Notable for the following:    WBC 24.9 (*)    Hemoglobin 17.9 (*)    MCHC 36.2 (*)    Neutrophils Relative % 92 (*)    Neutro Abs 23.0 (*)    Lymphocytes Relative 2 (*)    Lymphs Abs 0.4 (*)    Monocytes Absolute 1.5 (*)    All other components within normal limits  COMPREHENSIVE METABOLIC PANEL - Abnormal; Notable for the following:    CO2 18 (*)    Glucose, Bld 127 (*)    Total Protein 8.4 (*)    Albumin 5.2 (*)    All other components within normal limits  LIPASE, BLOOD -  Abnormal; Notable for the following:    Lipase 14 (*)    All other components within normal limits     7:19 PM Patient substantially better, will continue to receive IV fluids.  Update: Following initial fluid resuscitation the patient felt somewhat better   Update: Following a second liter of fluid resuscitation the patient states he had resolution of his abdominal pain, still mild nausea.  Update: On completion of 3 L normal saline resuscitation the patient has no nausea, pain, is ambulatory, drinking clear liquids. We had a lengthy conversation about all findings, the need for close monitoring, fluid resuscitation, return precautions specifically any localization of pain, persistency of nausea. MDM  Previously well generally healthy male presents with one day of nausea, vomiting, diarrhea, abdominal discomfort. Patient is non-peritoneal, afebrile on arrival.  Patient's history of multiple family members with the same illness, some requiring hospitalization suggests infectious etiology. Patient had no focal abdominal pain, and with complete resolution of pain, nausea, and with return of ambulatory status, tolerance of oral intake, there is low suspicion for acute appendicitis or other deep infection. After hours of monitoring, resuscitation, the patient is appropriate for discharge, with further evaluation, management to occur as an outpatient.  Gerhard Munch, MD 11/12/14 2358

## 2016-06-24 IMAGING — CR DG ANKLE COMPLETE 3+V*L*
3 series · 3 of 3 positions shown · non-contrast
Comparison: 11/21/2007

CLINICAL DATA: Jumped down 3 steps last night, struck a rock and
foot went out, felt something crack, limited range of motion,
lateral pain, initial encounter

EXAM:
LEFT ANKLE COMPLETE - 3+ VIEW

[ankle ap]
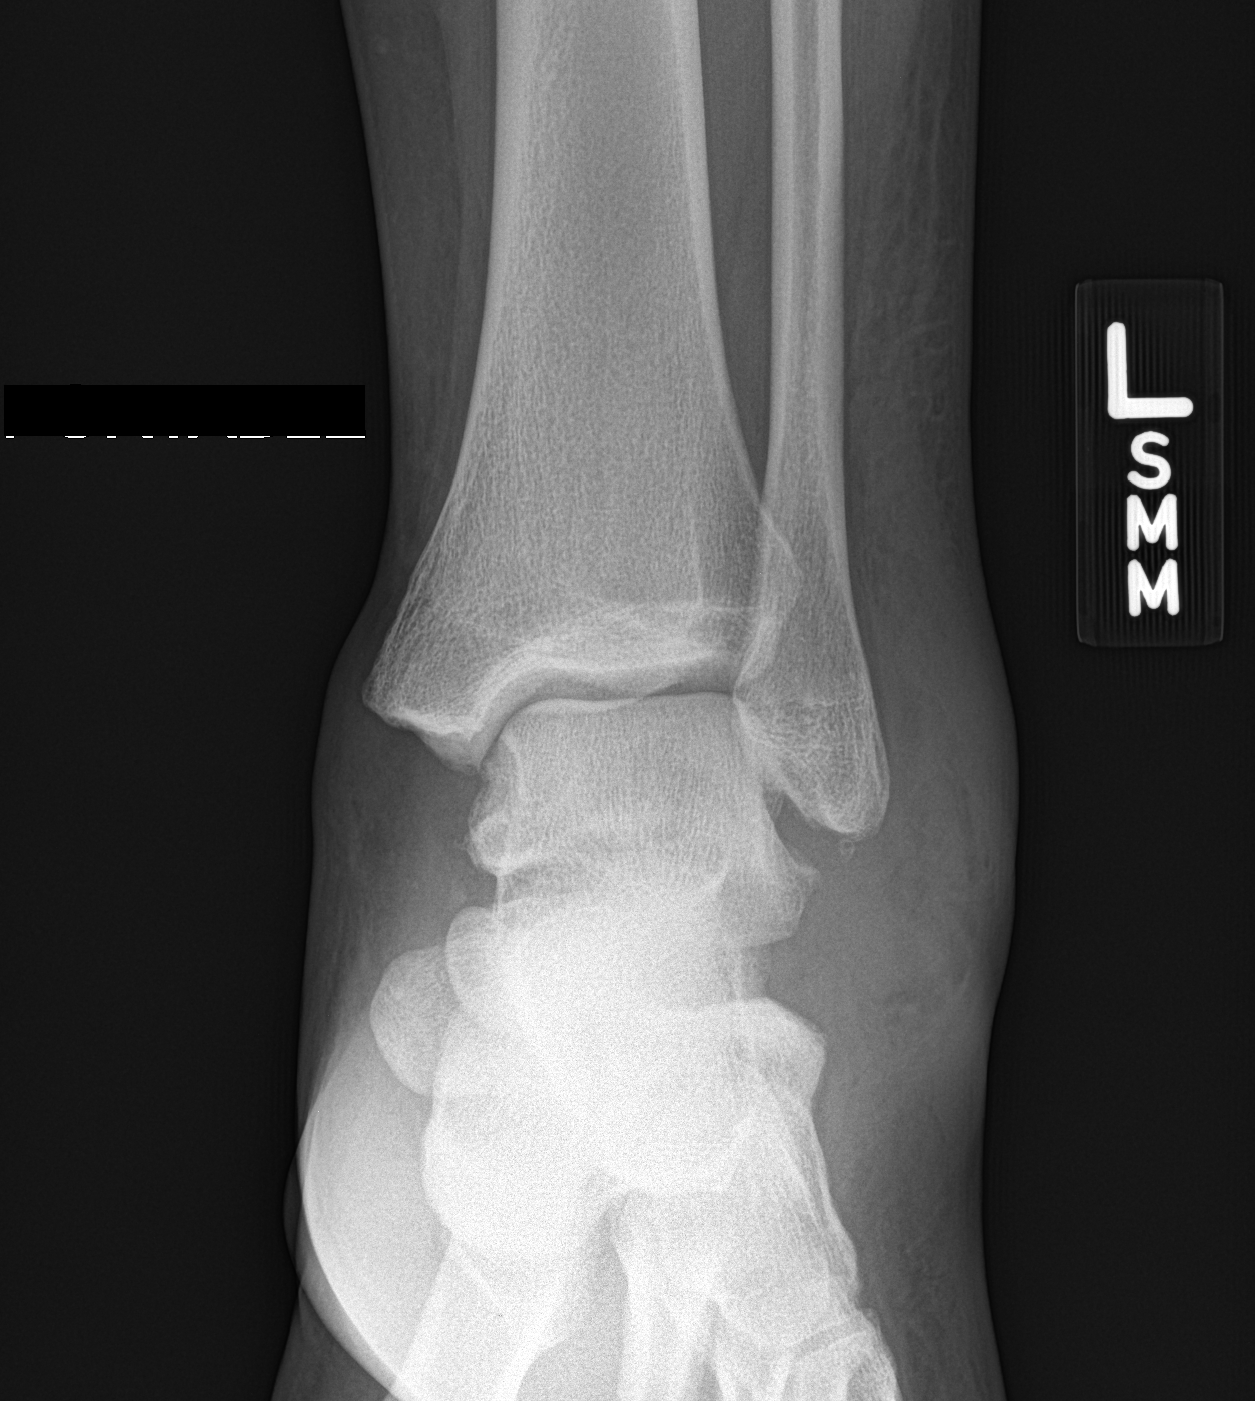

[ankle lat]
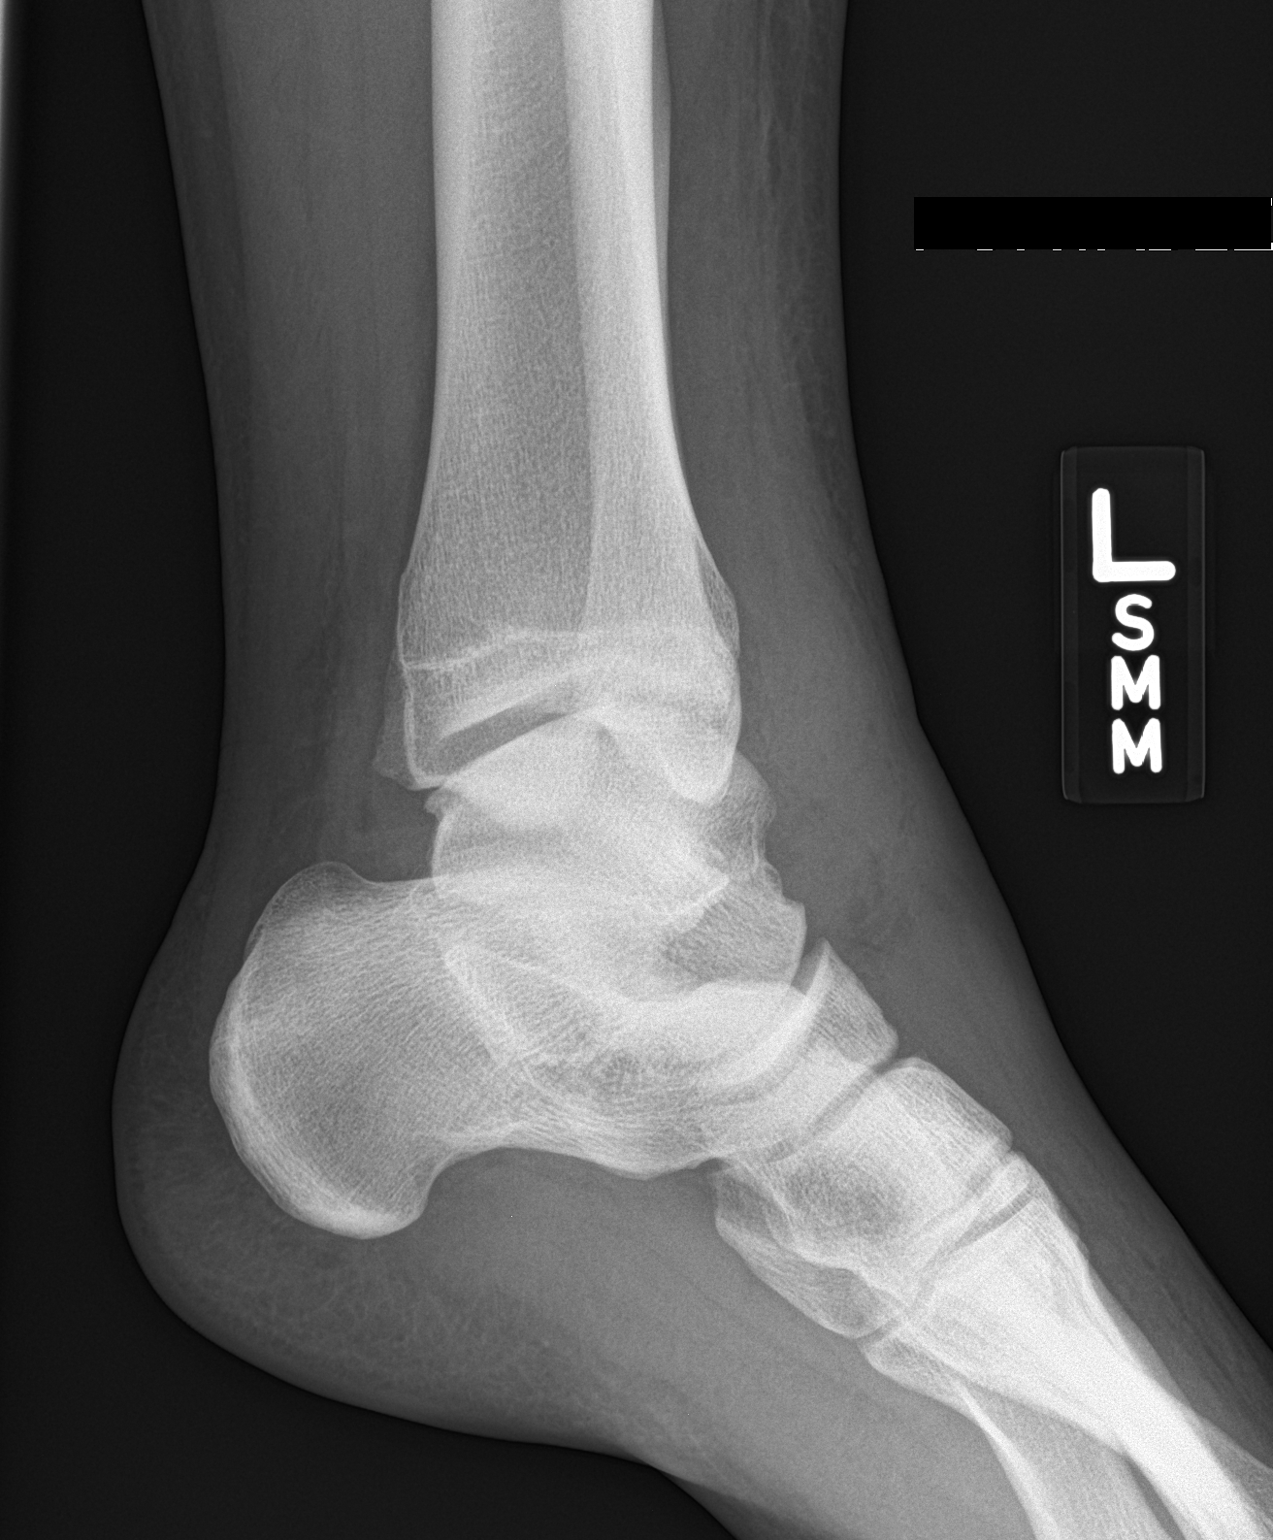

[ankle obl]
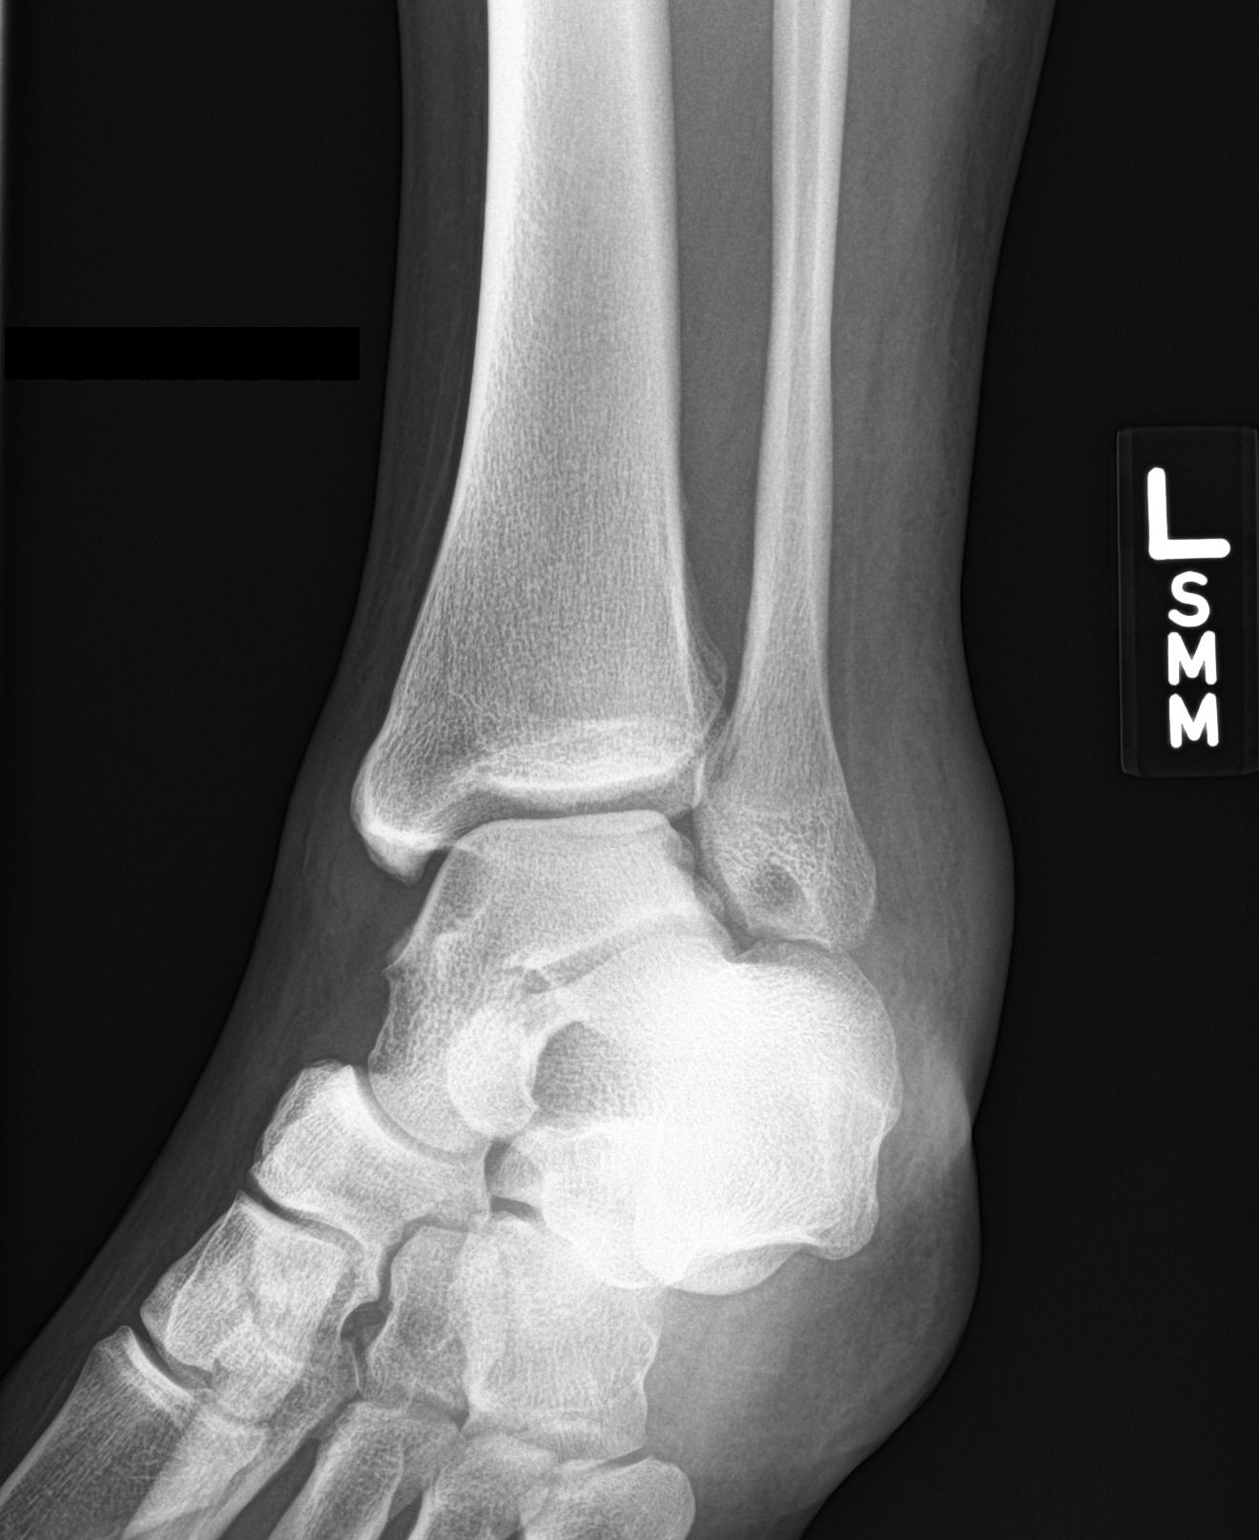

[3 of 3 positions shown; findings below may reference images not displayed]

FINDINGS: Soft tissue swelling particularly laterally and anteriorly.

Osseous mineralization normal.

Joint spaces preserved.

Tiny calcific density is seen laterally, slightly rounded and
atypical in appearance.

However, an apparent cortical defect is identified at the tip of the
lateral malleolus raising question of an avulsion fracture.

No additional fracture, dislocation or bone destruction.
IMPRESSION: Marked soft tissue swelling especially anteriorly and laterally.

Cortical defect at tip of lateral malleolus with a small adjacent
atypical calcific density suspicious for an avulsion fracture.

## 2017-09-25 ENCOUNTER — Ambulatory Visit: Payer: Self-pay | Admitting: General Surgery

## 2017-09-26 ENCOUNTER — Encounter (HOSPITAL_COMMUNITY): Payer: Self-pay | Admitting: *Deleted

## 2017-09-27 ENCOUNTER — Ambulatory Visit: Payer: Self-pay | Admitting: General Surgery

## 2017-09-27 NOTE — H&P (View-Only) (Signed)
Trinda Pascal Documented: 09/25/2017 1:45 PM Location: [facility] Patient #: [id number] DOB: Aug 12, 1990 Single / Language: [place] / Race: White Male  History of Present Illness Minerva Areola M. Analise Glotfelty MD; 09/27/2017 8:14 PM) The patient is a 27 year old male who presents for evaluation of gall stones. He comes in today to discuss his ongoing issues with epigastric and right upper quadrant pain. He has been to the urgent care center several times over the past year. He states that he has had episodic issues of epigastric and right upper quadrant pain. It lasts anywhere from minutes to hours. He has tried alternating his diet and hasn't really gotten any relief. He describes it as his stomach is hurting. He has noticed some correlation with dairy products and spicy foods. The discomfort and pain generally occurs after eating. He does not take NSAIDs on a frequent basis. He hasn't really found anything to relieve his discomfort. He is almost having daily nausea. He will also vomited small amounts. He denies any melena, hematochezia, or acholic stools. He denies weight loss. We were unable to get a copy of the clinic records despite multiple requests however we did get a copy of the abdominal ultrasound which showed gallstones. He has given up alcohol and did not notice any change in his symptoms.   Problem List/Past Medical [name], MD; 09/27/2017 8:14 PM) SYMPTOMATIC CHOLELITHIASIS (K80.20)  Past Surgical History Ethlyn Gallery, CMA; 09/25/2017 1:46 PM) Tonsillectomy  Diagnostic Studies History Elease Hashimoto Spillers, CMA; 09/25/2017 1:46 PM) Colonoscopy never  Allergies Elease Hashimoto Spillers, CMA; 09/25/2017 1:47 PM) No Known Drug Allergies [09/25/2017]:  Medication History (Alisha Spillers, CMA; 09/25/2017 1:47 PM) No Current Medications  Social History Ethlyn Gallery, CMA; 09/25/2017 1:46 PM) Alcohol use Occasional alcohol use. Caffeine use Coffee. Tobacco use  Current every day smoker.  Family History Ethlyn Gallery, CMA; 09/25/2017 1:46 PM) Diabetes Mellitus Family Members In General. Migraine Headache Mother, Sister.  Other Problems [name], MD; 09/27/2017 8:14 PM) Back Pain     Review of Systems Minerva Areola M. Muath Hallam MD; 09/27/2017 8:11 PM) General Not Present- Appetite Loss, Chills, Fatigue, Fever, Night Sweats, Weight Gain and Weight Loss. Gastrointestinal Not Present- Abdominal Pain, Bloating, Bloody Stool, Change in Bowel Habits, Chronic diarrhea, Constipation, Difficulty Swallowing, Excessive gas, Gets full quickly at meals, Hemorrhoids, Indigestion, Nausea, Rectal Pain and Vomiting. Male Genitourinary Not Present- Blood in Urine, Change in Urinary Stream, Frequency, Impotence, Nocturia, Painful Urination, Urgency and Urine Leakage. Neurological Not Present- Decreased Memory, Fainting, Headaches, Numbness, Seizures, Tingling, Tremor, Trouble walking and Weakness. Psychiatric Not Present- Anxiety, Bipolar, Change in Sleep Pattern, Depression, Fearful and Frequent crying. Hematology Not Present- Blood Thinners, Easy Bruising, Excessive bleeding, Gland problems, HIV and Persistent Infections. All other systems negative  Vitals (Alisha Spillers CMA; 09/25/2017 1:47 PM) 09/25/2017 1:46 PM Weight: 221.2 lb Height: 74in Body Surface Area: 2.27 m Body Mass Index: 28.4 kg/m  Pulse: 68 (Regular)  BP: 130/82 (Sitting, Left Arm, Standard)      Physical Exam Minerva Areola M. Dontre Laduca MD; 09/27/2017 8:11 PM)  General Mental Status-Alert. General Appearance-Consistent with stated age. Hydration-Well hydrated. Voice-Normal.  Head and Neck Head-normocephalic, atraumatic with no lesions or palpable masses. Trachea-midline. Thyroid Gland Characteristics - normal size and consistency.  Eye Eyeball - Bilateral-Extraocular movements intact. Sclera/Conjunctiva - Bilateral-No scleral icterus.  ENMT Ears -Note:normal  ext ears.  Mouth and Throat -Note:lips intact.   Chest and Lung Exam Chest and lung exam reveals -quiet, even and easy respiratory effort with no use  of accessory muscles and on auscultation, normal breath sounds, no adventitious sounds and normal vocal resonance. Inspection Chest Wall - Normal. Back - normal.  Breast - Did not examine.  Cardiovascular Cardiovascular examination reveals -normal heart sounds, regular rate and rhythm with no murmurs and normal pedal pulses bilaterally.  Abdomen Inspection Inspection of the abdomen reveals - No Hernias. Skin - Scar - no surgical scars. Palpation/Percussion Palpation and Percussion of the abdomen reveal - Soft, Non Tender, No Rebound tenderness, No Rigidity (guarding) and No hepatosplenomegaly. Auscultation Auscultation of the abdomen reveals - Bowel sounds normal.  Peripheral Vascular Upper Extremity Palpation - Pulses bilaterally normal.  Neurologic Neurologic evaluation reveals -alert and oriented x 3 with no impairment of recent or remote memory. Mental Status-Normal.  Neuropsychiatric The patient's mood and affect are described as -normal. Judgment and Insight-insight is appropriate concerning matters relevant to self.  Musculoskeletal Normal Exam - Left-Upper Extremity Strength Normal and Lower Extremity Strength Normal. Normal Exam - Right-Upper Extremity Strength Normal and Lower Extremity Strength Normal.  Lymphatic Head & Neck  General Head & Neck Lymphatics: Bilateral - Description - Normal. Axillary - Did not examine. Femoral & Inguinal - Did not examine.    Assessment & Plan Minerva Areola M. Beautiful Pensyl MD; 09/27/2017 8:11 PM)  SYMPTOMATIC CHOLELITHIASIS (K80.20) Impression: I believe the patient's symptoms are consistent with gallbladder disease.  We discussed gallbladder disease. The patient was given Agricultural engineer. We discussed non-operative and operative management. We discussed the signs  & symptoms of acute cholecystitis  I discussed laparoscopic cholecystectomy with IOC in detail. The patient was given educational material as well as diagrams detailing the procedure. We discussed the risks and benefits of a laparoscopic cholecystectomy including, but not limited to bleeding, infection, injury to surrounding structures such as the intestine or liver, bile leak, retained gallstones, need to convert to an open procedure, prolonged diarrhea, blood clots such as DVT, common bile duct injury, anesthesia risks, and possible need for additional procedures. We discussed the typical post-operative recovery course. I explained that the likelihood of improvement of their symptoms is good.  The patient has elected to proceed with surgery.  Current Plans Pt Education - Pamphlet Given - Laparoscopic Gallbladder Surgery: discussed with patient and provided information.  Mary Sella. [name], MD, FACS General, Bariatric, & Minimally Invasive Surgery [facility], [place]

## 2017-09-27 NOTE — H&P (Signed)
Leonard Day Documented: 09/25/2017 1:45 PM Location: [facility] Patient #: [id number] DOB: Aug 12, 1990 Single / Language: Leonard Day / Race: White Male  History of Present Illness Leonard Areola M. Adryel Wortmann MD; 09/27/2017 8:14 PM) The patient is a 27 year old male who presents for evaluation of gall stones. He comes in today to discuss his ongoing issues with epigastric and right upper quadrant pain. He has been to the urgent care center several times over the past year. He states that he has had episodic issues of epigastric and right upper quadrant pain. It lasts anywhere from minutes to hours. He has tried alternating his diet and hasn't really gotten any relief. He describes it as his stomach is hurting. He has noticed some correlation with dairy products and spicy foods. The discomfort and pain generally occurs after eating. He does not take NSAIDs on a frequent basis. He hasn't really found anything to relieve his discomfort. He is almost having daily nausea. He will also vomited small amounts. He denies any melena, hematochezia, or acholic stools. He denies weight loss. We were unable to get a copy of the clinic records despite multiple requests however we did get a copy of the abdominal ultrasound which showed gallstones. He has given up alcohol and did not notice any change in his symptoms.   Problem List/Past Medical Leonard Areola M. [name], MD; 09/27/2017 8:14 PM) SYMPTOMATIC CHOLELITHIASIS (K80.20)  Past Surgical History Leonard Day, CMA; 09/25/2017 1:46 PM) Tonsillectomy  Diagnostic Studies History Leonard Day, CMA; 09/25/2017 1:46 PM) Colonoscopy never  Allergies Leonard Day, CMA; 09/25/2017 1:47 PM) No Known Drug Allergies [09/25/2017]:  Medication History (Leonard Day, CMA; 09/25/2017 1:47 PM) No Current Medications  Social History Leonard Day, CMA; 09/25/2017 1:46 PM) Alcohol use Occasional alcohol use. Caffeine use Coffee. Tobacco use  Current every day smoker.  Family History Leonard Day, CMA; 09/25/2017 1:46 PM) Diabetes Mellitus Family Members In General. Migraine Headache Mother, Sister.  Other Problems Leonard Areola M. [name], MD; 09/27/2017 8:14 PM) Back Pain     Review of Systems Leonard Areola M. Kloee Ballew MD; 09/27/2017 8:11 PM) General Not Present- Appetite Loss, Chills, Fatigue, Fever, Night Sweats, Weight Gain and Weight Loss. Gastrointestinal Not Present- Abdominal Pain, Bloating, Bloody Stool, Change in Bowel Habits, Chronic diarrhea, Constipation, Difficulty Swallowing, Excessive gas, Gets full quickly at meals, Hemorrhoids, Indigestion, Nausea, Rectal Pain and Vomiting. Male Genitourinary Not Present- Blood in Urine, Change in Urinary Stream, Frequency, Impotence, Nocturia, Painful Urination, Urgency and Urine Leakage. Neurological Not Present- Decreased Memory, Fainting, Headaches, Numbness, Seizures, Tingling, Tremor, Trouble walking and Weakness. Psychiatric Not Present- Anxiety, Bipolar, Change in Sleep Pattern, Depression, Fearful and Frequent crying. Hematology Not Present- Blood Thinners, Easy Bruising, Excessive bleeding, Gland problems, HIV and Persistent Infections. All other systems negative  Vitals (Leonard Day CMA; 09/25/2017 1:47 PM) 09/25/2017 1:46 PM Weight: 221.2 lb Height: 74in Body Surface Area: 2.27 m Body Mass Index: 28.4 kg/m  Pulse: 68 (Regular)  BP: 130/82 (Sitting, Left Arm, Standard)      Physical Exam Leonard Areola M. Keaunna Skipper MD; 09/27/2017 8:11 PM)  General Mental Status-Alert. General Appearance-Consistent with stated age. Hydration-Well hydrated. Voice-Normal.  Head and Neck Head-normocephalic, atraumatic with no lesions or palpable masses. Trachea-midline. Thyroid Gland Characteristics - normal size and consistency.  Eye Eyeball - Bilateral-Extraocular movements intact. Sclera/Conjunctiva - Bilateral-No scleral icterus.  ENMT Ears -Note:normal  ext ears.  Mouth and Throat -Note:lips intact.   Chest and Lung Exam Chest and lung exam reveals -quiet, even and easy respiratory effort with no use  of accessory muscles and on auscultation, normal breath sounds, no adventitious sounds and normal vocal resonance. Inspection Chest Wall - Normal. Back - normal.  Breast - Did not examine.  Cardiovascular Cardiovascular examination reveals -normal heart sounds, regular rate and rhythm with no murmurs and normal pedal pulses bilaterally.  Abdomen Inspection Inspection of the abdomen reveals - No Hernias. Skin - Scar - no surgical scars. Palpation/Percussion Palpation and Percussion of the abdomen reveal - Soft, Non Tender, No Rebound tenderness, No Rigidity (guarding) and No hepatosplenomegaly. Auscultation Auscultation of the abdomen reveals - Bowel sounds normal.  Peripheral Vascular Upper Extremity Palpation - Pulses bilaterally normal.  Neurologic Neurologic evaluation reveals -alert and oriented x 3 with no impairment of recent or remote memory. Mental Status-Normal.  Neuropsychiatric The patient's mood and affect are described as -normal. Judgment and Insight-insight is appropriate concerning matters relevant to self.  Musculoskeletal Normal Exam - Left-Upper Extremity Strength Normal and Lower Extremity Strength Normal. Normal Exam - Right-Upper Extremity Strength Normal and Lower Extremity Strength Normal.  Lymphatic Head & Neck  General Head & Neck Lymphatics: Bilateral - Description - Normal. Axillary - Did not examine. Femoral & Inguinal - Did not examine.    Assessment & Plan (Leonard Mccadden M. Reigna Ruperto MD; 09/27/2017 8:11 PM)  SYMPTOMATIC CHOLELITHIASIS (K80.20) Impression: I believe the patient's symptoms are consistent with gallbladder disease.  We discussed gallbladder disease. The patient was given educational material. We discussed non-operative and operative management. We discussed the signs  & symptoms of acute cholecystitis  I discussed laparoscopic cholecystectomy with IOC in detail. The patient was given educational material as well as diagrams detailing the procedure. We discussed the risks and benefits of a laparoscopic cholecystectomy including, but not limited to bleeding, infection, injury to surrounding structures such as the intestine or liver, bile leak, retained gallstones, need to convert to an open procedure, prolonged diarrhea, blood clots such as DVT, common bile duct injury, anesthesia risks, and possible need for additional procedures. We discussed the typical post-operative recovery course. I explained that the likelihood of improvement of their symptoms is good.  The patient has elected to proceed with surgery.  Current Plans Pt Education - Pamphlet Given - Laparoscopic Gallbladder Surgery: discussed with patient and provided information.  Edgar Corrigan M. Rober Skeels, MD, FACS General, Bariatric, & Minimally Invasive Surgery [facility] [place]  

## 2017-09-28 NOTE — Patient Instructions (Addendum)
Leonard Day  09/28/2017   Your procedure is scheduled on: 10-02-17  Report to [facility] Main  Entrance  Report to admitting at 1130 AM    Call this number if you have problems the morning of surgery [phone]   Remember: Do not eat food  :After Midnight.  NO SOLID FOOD AFTER MIDNIGHT THE NIGHT PRIOR TO SURGERY. NOTHING BY MOUTH EXCEPT CLEAR LIQUIDS UNTIL 3 HOURS PRIOR TO SCHEULED SURGERY. PLEASE FINISH ENSURE DRINK PER SURGEON ORDER 3 HOURS PRIOR TO SCHEDULED SURGERY TIME WHICH NEEDS TO BE COMPLETED AT 1030 am.  CLEAR LIQUID DIET   Foods Allowed                                                                     Foods Excluded  Coffee and tea, regular and decaf                             liquids that you cannot  Plain Jell-O in any flavor                                             see through such as: Fruit ices (not with fruit pulp)                                     milk, soups, orange juice  Iced Popsicles                                    All solid food Carbonated beverages, regular and diet                                    Cranberry, grape and apple juices Sports drinks like Gatorade Lightly seasoned clear broth or consume(fat free) Sugar, honey syrup  Sample Menu Breakfast                                Lunch                                     Supper Cranberry juice                    Beef broth                            Chicken broth Jell-O                                     Grape juice  Apple juice Coffee or tea                        Jell-O                                      Popsicle                                                Coffee or tea                        Coffee or tea  _____________________________________________________________________    Take these medicines the morning of surgery with A SIP OF WATER: NONE                                You may not have any metal on your body including  hair pins and              piercings  Do not wear jewelry, make-up, lotions, powders or perfumes, deodorant             Do not wear nail polish.  Do not shave  48 hours prior to surgery.              Men may shave face and neck.   Do not bring valuables to the hospital. [facility] IS NOT             RESPONSIBLE   FOR VALUABLES.  Contacts, dentures or bridgework may not be worn into surgery.  Leave suitcase in the car. After surgery it may be brought to your room.     Patients discharged the day of surgery will not be allowed to drive home.  Name and phone number of your driver:  Special Instructions: N/A              Please read over the following fact sheets you were given: _____________________________________________________________________  [facility] - Preparing for Surgery Before surgery, you can play an important role.  Because skin is not sterile, your skin needs to be as free of germs as possible.  You can reduce the number of germs on your skin by washing with CHG (chlorahexidine gluconate) soap before surgery.  CHG is an antiseptic cleaner which kills germs and bonds with the skin to continue killing germs even after washing. Please DO NOT use if you have an allergy to CHG or antibacterial soaps.  If your skin becomes reddened/irritated stop using the CHG and inform your nurse when you arrive at Short Stay. Do not shave (including legs and underarms) for at least 48 hours prior to the first CHG shower.  You may shave your face/neck. Please follow these instructions carefully:  1.  Shower with CHG Soap the night before surgery and the  morning of Surgery.  2.  If you choose to wash your hair, wash your hair first as usual with your  normal  shampoo.  3.  After you shampoo, rinse your hair and body thoroughly to remove the  shampoo.  4.  Use CHG as you would any other liquid soap.  You can apply chg directly  to the skin and wash                        Gently with a scrungie or clean washcloth.  5.  Apply the CHG Soap to your body ONLY FROM THE NECK DOWN.   Do not use on face/ open                           Wound or open sores. Avoid contact with eyes, ears mouth and genitals (private parts).                       Wash face,  Genitals (private parts) with your normal soap.             6.  Wash thoroughly, paying special attention to the area where your surgery  will be performed.  7.  Thoroughly rinse your body with warm water from the neck down.  8.  DO NOT shower/wash with your normal soap after using and rinsing off  the CHG Soap.                9.  Pat yourself dry with a clean towel.            10.  Wear clean pajamas.            11.  Place clean sheets on your bed the night of your first shower and do not  sleep with pets. Day of Surgery : Do not apply any lotions/deodorants the morning of surgery.  Please wear clean clothes to the hospital/surgery center.  FAILURE TO FOLLOW THESE INSTRUCTIONS MAY RESULT IN THE CANCELLATION OF YOUR SURGERY PATIENT SIGNATURE_________________________________  NURSE SIGNATURE__________________________________  ________________________________________________________________________

## 2017-10-01 ENCOUNTER — Other Ambulatory Visit: Payer: Self-pay

## 2017-10-01 ENCOUNTER — Encounter (HOSPITAL_COMMUNITY)
Admission: RE | Admit: 2017-10-01 | Discharge: 2017-10-01 | Disposition: A | Discharge status: Home / Self Care | Payer: Federal, State, Local not specified - PPO | Source: Ambulatory Visit | Attending: General Surgery | Admitting: General Surgery

## 2017-10-01 ENCOUNTER — Encounter (HOSPITAL_COMMUNITY): Payer: Self-pay

## 2017-10-01 DIAGNOSIS — Z01812 Encounter for preprocedural laboratory examination: Secondary | ICD-10-CM | POA: Insufficient documentation

## 2017-10-01 LAB — CBC WITH DIFFERENTIAL/PLATELET
Basophils Absolute: 0.1 10*3/uL (ref 0.0–0.1)
Basophils Relative: 0 %
Eosinophils Absolute: 0.4 10*3/uL (ref 0.0–0.7)
Eosinophils Relative: 3 %
HEMATOCRIT: 43.8 % (ref 39.0–52.0)
HEMOGLOBIN: 15 g/dL (ref 13.0–17.0)
LYMPHS PCT: 23 %
Lymphs Abs: 3.1 10*3/uL (ref 0.7–4.0)
MCH: 30.8 pg (ref 26.0–34.0)
MCHC: 34.2 g/dL (ref 30.0–36.0)
MCV: 89.9 fL (ref 78.0–100.0)
MONOS PCT: 9 %
Monocytes Absolute: 1.2 10*3/uL — ABNORMAL HIGH (ref 0.1–1.0)
NEUTROS ABS: 9 10*3/uL — AB (ref 1.7–7.7)
Neutrophils Relative %: 65 %
Platelets: 292 10*3/uL (ref 150–400)
RBC: 4.87 MIL/uL (ref 4.22–5.81)
RDW: 12.4 % (ref 11.5–15.5)
WBC: 13.8 10*3/uL — AB (ref 4.0–10.5)

## 2017-10-01 LAB — COMPREHENSIVE METABOLIC PANEL
ALK PHOS: 53 U/L (ref 38–126)
ALT: 25 U/L (ref 17–63)
AST: 23 U/L (ref 15–41)
Albumin: 4.4 g/dL (ref 3.5–5.0)
Anion gap: 10 (ref 5–15)
BUN: 10 mg/dL (ref 6–20)
CO2: 24 mmol/L (ref 22–32)
Calcium: 9.2 mg/dL (ref 8.9–10.3)
Chloride: 107 mmol/L (ref 101–111)
Creatinine, Ser: 1.03 mg/dL (ref 0.61–1.24)
GFR calc non Af Amer: >60 mL/min (ref >60)
GLUCOSE: 99 mg/dL (ref 65–99)
Potassium: 4.4 mmol/L (ref 3.5–5.1)
Sodium: 141 mmol/L (ref 135–145)
Total Bilirubin: 0.6 mg/dL (ref 0.3–1.2)
Total Protein: 7.1 g/dL (ref 6.5–8.1)

## 2017-10-01 NOTE — Progress Notes (Signed)
Spoke with dr rose and made aware patient with recent history of ketamine use. Order received for urine drug screen day of surgery from dr rose. Order entered in epic.

## 2017-10-02 ENCOUNTER — Encounter (HOSPITAL_COMMUNITY): Payer: Self-pay | Admitting: *Deleted

## 2017-10-02 ENCOUNTER — Ambulatory Visit (HOSPITAL_COMMUNITY): Payer: Federal, State, Local not specified - PPO | Admitting: Anesthesiology

## 2017-10-02 ENCOUNTER — Ambulatory Visit (HOSPITAL_COMMUNITY)
Admission: RE | Admit: 2017-10-02 | Discharge: 2017-10-02 | Disposition: A | Discharge status: Home / Self Care | Payer: Federal, State, Local not specified - PPO | Source: Ambulatory Visit | Attending: General Surgery | Admitting: General Surgery

## 2017-10-02 ENCOUNTER — Encounter (HOSPITAL_COMMUNITY)
Admission: RE | Disposition: A | Discharge status: Home / Self Care | Payer: Self-pay | Source: Ambulatory Visit | Attending: General Surgery

## 2017-10-02 DIAGNOSIS — K8010 Calculus of gallbladder with chronic cholecystitis without obstruction: Secondary | ICD-10-CM | POA: Diagnosis present

## 2017-10-02 DIAGNOSIS — Z833 Family history of diabetes mellitus: Secondary | ICD-10-CM | POA: Insufficient documentation

## 2017-10-02 DIAGNOSIS — Z820 Family history of epilepsy and other diseases of the nervous system: Secondary | ICD-10-CM | POA: Diagnosis not present

## 2017-10-02 DIAGNOSIS — F17200 Nicotine dependence, unspecified, uncomplicated: Secondary | ICD-10-CM | POA: Diagnosis not present

## 2017-10-02 DIAGNOSIS — M549 Dorsalgia, unspecified: Secondary | ICD-10-CM | POA: Diagnosis not present

## 2017-10-02 HISTORY — PX: CHOLECYSTECTOMY: SHX55

## 2017-10-02 LAB — RAPID URINE DRUG SCREEN, HOSP PERFORMED
AMPHETAMINES: NOT DETECTED
BARBITURATES: NOT DETECTED
BENZODIAZEPINES: POSITIVE — AB
Cocaine: NOT DETECTED
Opiates: NOT DETECTED
TETRAHYDROCANNABINOL: POSITIVE — AB

## 2017-10-02 SURGERY — LAPAROSCOPIC CHOLECYSTECTOMY WITH INTRAOPERATIVE CHOLANGIOGRAM
Anesthesia: General | Site: Abdomen

## 2017-10-02 MED ORDER — HYDROMORPHONE HCL 1 MG/ML IJ SOLN
0.2500 mg | INTRAMUSCULAR | Status: DC | PRN
  Administered 2017-10-02 (×4): 0.5 mg via INTRAVENOUS

## 2017-10-02 MED ORDER — BUPIVACAINE-EPINEPHRINE (PF) 0.25% -1:200000 IJ SOLN
INTRAMUSCULAR | Status: AC
  Filled 2017-10-02: qty 30

## 2017-10-02 MED ORDER — FENTANYL CITRATE (PF) 250 MCG/5ML IJ SOLN
INTRAMUSCULAR | Status: AC
  Filled 2017-10-02: qty 5

## 2017-10-02 MED ORDER — ACETAMINOPHEN 500 MG PO TABS
1000.0000 mg | ORAL_TABLET | ORAL | Status: AC
  Administered 2017-10-02: 1000 mg via ORAL
  Filled 2017-10-02: qty 2

## 2017-10-02 MED ORDER — DEXMEDETOMIDINE HCL 200 MCG/2ML IV SOLN
INTRAVENOUS | Status: DC | PRN
  Administered 2017-10-02: 8 ug via INTRAVENOUS
  Administered 2017-10-02: 12 ug via INTRAVENOUS

## 2017-10-02 MED ORDER — FENTANYL CITRATE (PF) 100 MCG/2ML IJ SOLN
INTRAMUSCULAR | Status: DC | PRN
  Administered 2017-10-02: 100 ug via INTRAVENOUS
  Administered 2017-10-02 (×5): 50 ug via INTRAVENOUS
  Administered 2017-10-02: 100 ug via INTRAVENOUS

## 2017-10-02 MED ORDER — HYDROMORPHONE HCL 1 MG/ML IJ SOLN
INTRAMUSCULAR | Status: AC
  Filled 2017-10-02: qty 1

## 2017-10-02 MED ORDER — MEPERIDINE HCL 50 MG/ML IJ SOLN: 6.2500 mg | INTRAMUSCULAR | Status: DC | PRN

## 2017-10-02 MED ORDER — ONDANSETRON HCL 4 MG/2ML IJ SOLN: 4.0000 mg | Freq: Once | INTRAMUSCULAR | Status: DC | PRN

## 2017-10-02 MED ORDER — CEFOTETAN DISODIUM-DEXTROSE 2-2.08 GM-%(50ML) IV SOLR
2.0000 g | INTRAVENOUS | Status: AC
  Administered 2017-10-02: 2 g via INTRAVENOUS
  Filled 2017-10-02: qty 50

## 2017-10-02 MED ORDER — LIDOCAINE 2% (20 MG/ML) 5 ML SYRINGE
INTRAMUSCULAR | Status: DC | PRN
  Administered 2017-10-02: 100 mg via INTRAVENOUS

## 2017-10-02 MED ORDER — 0.9 % SODIUM CHLORIDE (POUR BTL) OPTIME
TOPICAL | Status: DC | PRN
  Administered 2017-10-02: 1000 mL

## 2017-10-02 MED ORDER — IOPAMIDOL (ISOVUE-300) INJECTION 61%
INTRAVENOUS | Status: AC
  Filled 2017-10-02: qty 50

## 2017-10-02 MED ORDER — GABAPENTIN 300 MG PO CAPS
300.0000 mg | ORAL_CAPSULE | ORAL | Status: AC
  Administered 2017-10-02: 300 mg via ORAL
  Filled 2017-10-02: qty 1

## 2017-10-02 MED ORDER — OXYCODONE HCL 5 MG PO TABS
5.0000 mg | ORAL_TABLET | Freq: Four times a day (QID) | ORAL | 0 refills | Status: AC | PRN
Start: 2017-10-02 — End: ?

## 2017-10-02 MED ORDER — DEXMEDETOMIDINE HCL IN NACL 200 MCG/50ML IV SOLN
INTRAVENOUS | Status: AC
Start: 2017-10-02 — End: ?
  Filled 2017-10-02: qty 50

## 2017-10-02 MED ORDER — PHENYLEPHRINE 40 MCG/ML (10ML) SYRINGE FOR IV PUSH (FOR BLOOD PRESSURE SUPPORT)
PREFILLED_SYRINGE | INTRAVENOUS | Status: AC
  Filled 2017-10-02: qty 30

## 2017-10-02 MED ORDER — CHLORHEXIDINE GLUCONATE 4 % EX LIQD: 60.0000 mL | Freq: Once | CUTANEOUS | Status: DC

## 2017-10-02 MED ORDER — MIDAZOLAM HCL 2 MG/2ML IJ SOLN
INTRAMUSCULAR | Status: AC
  Filled 2017-10-02: qty 2

## 2017-10-02 MED ORDER — BUPIVACAINE-EPINEPHRINE 0.25% -1:200000 IJ SOLN
INTRAMUSCULAR | Status: DC | PRN
  Administered 2017-10-02 (×2): 30 mL

## 2017-10-02 MED ORDER — SUGAMMADEX SODIUM 200 MG/2ML IV SOLN
INTRAVENOUS | Status: DC | PRN
  Administered 2017-10-02: 200 mg via INTRAVENOUS

## 2017-10-02 MED ORDER — LACTATED RINGERS IR SOLN
Status: DC | PRN
  Administered 2017-10-02: 2000 mL

## 2017-10-02 MED ORDER — ONDANSETRON HCL 4 MG/2ML IJ SOLN
INTRAMUSCULAR | Status: AC
  Filled 2017-10-02: qty 2

## 2017-10-02 MED ORDER — LACTATED RINGERS IV SOLN
INTRAVENOUS | Status: DC
  Administered 2017-10-02 (×3): via INTRAVENOUS

## 2017-10-02 MED ORDER — OXYCODONE HCL 5 MG PO TABS
5.0000 mg | ORAL_TABLET | Freq: Once | ORAL | Status: AC
  Administered 2017-10-02: 5 mg via ORAL

## 2017-10-02 MED ORDER — PROPOFOL 10 MG/ML IV BOLUS
INTRAVENOUS | Status: AC
  Filled 2017-10-02: qty 20

## 2017-10-02 MED ORDER — MIDAZOLAM HCL 5 MG/5ML IJ SOLN
INTRAMUSCULAR | Status: DC | PRN
  Administered 2017-10-02: 2 mg via INTRAVENOUS

## 2017-10-02 MED ORDER — OXYCODONE HCL 5 MG PO TABS
ORAL_TABLET | ORAL | Status: AC
  Filled 2017-10-02: qty 1

## 2017-10-02 MED ORDER — ROCURONIUM BROMIDE 10 MG/ML (PF) SYRINGE
PREFILLED_SYRINGE | INTRAVENOUS | Status: DC | PRN
  Administered 2017-10-02 (×2): 10 mg via INTRAVENOUS
  Administered 2017-10-02: 50 mg via INTRAVENOUS

## 2017-10-02 MED ORDER — PROPOFOL 10 MG/ML IV BOLUS
INTRAVENOUS | Status: DC | PRN
  Administered 2017-10-02: 50 mg via INTRAVENOUS
  Administered 2017-10-02: 150 mg via INTRAVENOUS

## 2017-10-02 SURGICAL SUPPLY — 58 items
APPLICATOR ARISTA FLEXITIP XL (MISCELLANEOUS) IMPLANT
APPLIER CLIP 5 13 M/L LIGAMAX5 (MISCELLANEOUS) ×6
APPLIER CLIP ROT 10 11.4 M/L (STAPLE)
BANDAGE ADH SHEER 1  50/CT (GAUZE/BANDAGES/DRESSINGS) ×3 IMPLANT
BENZOIN TINCTURE PRP APPL 2/3 (GAUZE/BANDAGES/DRESSINGS) IMPLANT
CABLE HIGH FREQUENCY MONO STRZ (ELECTRODE) ×3 IMPLANT
CHLORAPREP W/TINT 26ML (MISCELLANEOUS) ×3 IMPLANT
CLIP APPLIE 5 13 M/L LIGAMAX5 (MISCELLANEOUS) ×2 IMPLANT
CLIP APPLIE ROT 10 11.4 M/L (STAPLE) IMPLANT
CLIP VESOLOCK MED LG 6/CT (CLIP) IMPLANT
CLOSURE WOUND 1/2 X4 (GAUZE/BANDAGES/DRESSINGS)
CLOSURE WOUND 1/4 X3 (GAUZE/BANDAGES/DRESSINGS) ×1
COVER MAYO STAND STRL (DRAPES) IMPLANT
COVER SURGICAL LIGHT HANDLE (MISCELLANEOUS) ×3 IMPLANT
DECANTER SPIKE VIAL GLASS SM (MISCELLANEOUS) ×3 IMPLANT
DERMABOND ADVANCED (GAUZE/BANDAGES/DRESSINGS)
DERMABOND ADVANCED .7 DNX12 (GAUZE/BANDAGES/DRESSINGS) IMPLANT
DRSG TEGADERM 2-3/8X2-3/4 SM (GAUZE/BANDAGES/DRESSINGS) ×3 IMPLANT
ELECT PENCIL ROCKER SW 15FT (MISCELLANEOUS) IMPLANT
ELECT REM PT RETURN 15FT ADLT (MISCELLANEOUS) ×3 IMPLANT
ENDOLOOP SUT PDS II  0 18 (SUTURE) ×2
ENDOLOOP SUT PDS II 0 18 (SUTURE) ×1 IMPLANT
GAUZE SPONGE 2X2 8PLY STRL LF (GAUZE/BANDAGES/DRESSINGS) ×1 IMPLANT
GLOVE BIO SURGEON STRL SZ7.5 (GLOVE) ×3 IMPLANT
GLOVE BIOGEL PI IND STRL 7.5 (GLOVE) ×1 IMPLANT
GLOVE BIOGEL PI INDICATOR 7.5 (GLOVE) ×2
GLOVE ECLIPSE 6.5 STRL STRAW (GLOVE) ×6 IMPLANT
GLOVE ECLIPSE 7.5 STRL STRAW (GLOVE) ×3 IMPLANT
GLOVE INDICATOR 6.5 STRL GRN (GLOVE) ×3 IMPLANT
GLOVE INDICATOR 8.0 STRL GRN (GLOVE) ×3 IMPLANT
GOWN STRL REUS W/ TWL LRG LVL3 (GOWN DISPOSABLE) ×1 IMPLANT
GOWN STRL REUS W/TWL LRG LVL3 (GOWN DISPOSABLE) ×2
GOWN STRL REUS W/TWL XL LVL3 (GOWN DISPOSABLE) ×6 IMPLANT
GRASPER SUT TROCAR 14GX15 (MISCELLANEOUS) IMPLANT
HEMOSTAT ARISTA ABSORB 3G PWDR (MISCELLANEOUS) IMPLANT
HEMOSTAT SNOW SURGICEL 2X4 (HEMOSTASIS) IMPLANT
KIT BASIN OR (CUSTOM PROCEDURE TRAY) ×3 IMPLANT
L-HOOK LAP DISP 36CM (ELECTROSURGICAL)
LHOOK LAP DISP 36CM (ELECTROSURGICAL) IMPLANT
POUCH RETRIEVAL ECOSAC 10 (ENDOMECHANICALS) ×1 IMPLANT
POUCH RETRIEVAL ECOSAC 10MM (ENDOMECHANICALS) ×2
SCISSORS LAP 5X35 DISP (ENDOMECHANICALS) ×3 IMPLANT
SET CHOLANGIOGRAPH MIX (MISCELLANEOUS) IMPLANT
SET IRRIG TUBING LAPAROSCOPIC (IRRIGATION / IRRIGATOR) ×3 IMPLANT
SLEEVE XCEL OPT CAN 5 100 (ENDOMECHANICALS) ×9 IMPLANT
SPONGE GAUZE 2X2 STER 10/PKG (GAUZE/BANDAGES/DRESSINGS) ×2
STRIP CLOSURE SKIN 1/2X4 (GAUZE/BANDAGES/DRESSINGS) IMPLANT
STRIP CLOSURE SKIN 1/4X3 (GAUZE/BANDAGES/DRESSINGS) ×2 IMPLANT
SUT MNCRL AB 4-0 PS2 18 (SUTURE) ×6 IMPLANT
SUT VICRYL 0 TIES 12 18 (SUTURE) IMPLANT
SUT VICRYL 0 UR6 27IN ABS (SUTURE) IMPLANT
TOWEL OR 17X26 10 PK STRL BLUE (TOWEL DISPOSABLE) ×3 IMPLANT
TOWEL OR NON WOVEN STRL DISP B (DISPOSABLE) ×3 IMPLANT
TRAY LAPAROSCOPIC (CUSTOM PROCEDURE TRAY) ×3 IMPLANT
TROCAR BLADELESS OPT 5 100 (ENDOMECHANICALS) ×3 IMPLANT
TROCAR XCEL BLUNT TIP 100MML (ENDOMECHANICALS) IMPLANT
TROCAR XCEL NON-BLD 11X100MML (ENDOMECHANICALS) IMPLANT
TUBING INSUF HEATED (TUBING) ×3 IMPLANT

## 2017-10-02 NOTE — Transfer of Care (Signed)
Immediate Anesthesia Transfer of Care Note  Patient: Leonard Day  Procedure(s) Performed: LAPAROSCOPIC CHOLECYSTECTOMY (N/A Abdomen)  Patient Location: PACU  Anesthesia Type:General  Level of Consciousness: sedated, patient cooperative and responds to stimulation  Airway & Oxygen Therapy: Patient Spontanous Breathing and Patient connected to face mask oxygen  Post-op Assessment: Report given to RN and Post -op Vital signs reviewed and stable  Post vital signs: Reviewed and stable  Last Vitals:  Vitals Value Taken Time  BP 166/89 10/02/2017  4:43 PM  Temp    Pulse 83 10/02/2017  4:44 PM  Resp 19 10/02/2017  4:44 PM  SpO2 98 % 10/02/2017  4:44 PM  Vitals shown include unvalidated device data.  Last Pain:  Vitals:   10/02/17 1157  TempSrc:   PainSc: 1          Complications: No apparent anesthesia complications

## 2017-10-02 NOTE — Anesthesia Postprocedure Evaluation (Signed)
Anesthesia Post Note  Patient: Leonard Day  Procedure(s) Performed: LAPAROSCOPIC CHOLECYSTECTOMY (N/A Abdomen)     Patient location during evaluation: PACU Anesthesia Type: General Level of consciousness: awake and alert Pain management: pain level controlled Vital Signs Assessment: post-procedure vital signs reviewed and stable Respiratory status: spontaneous breathing, nonlabored ventilation, respiratory function stable and patient connected to nasal cannula oxygen Cardiovascular status: blood pressure returned to baseline and stable Postop Assessment: no apparent nausea or vomiting Anesthetic complications: no    Last Vitals:  Vitals:   10/02/17 1715 10/02/17 1740  BP: (!) 160/96 (!) 162/88  Pulse: 69 77  Resp: 17 18  Temp: 36.7 C (!) 36.3 C  SpO2: 98% 99%    Last Pain:  Vitals:   10/02/17 1740  TempSrc:   PainSc: 5                  Jamile Rekowski DAVID

## 2017-10-02 NOTE — Anesthesia Preprocedure Evaluation (Addendum)
Anesthesia Evaluation  Patient identified by MRN, date of birth, ID band Patient awake    Reviewed: Allergy & Precautions, NPO status , Patient's Chart, lab work & pertinent test results  Airway Mallampati: I  TM Distance: >3 FB Neck ROM: Full    Dental   Pulmonary Current Smoker,    Pulmonary exam normal        Cardiovascular Normal cardiovascular exam     Neuro/Psych    GI/Hepatic   Endo/Other    Renal/GU      Musculoskeletal   Abdominal   Peds  Hematology   Anesthesia Other Findings   Reproductive/Obstetrics                             Anesthesia Physical Anesthesia Plan  ASA: II  Anesthesia Plan: General   Post-op Pain Management:    Induction: Intravenous  PONV Risk Score and Plan: 1 and Ondansetron  Airway Management Planned: Oral ETT  Additional Equipment:   Intra-op Plan:   Post-operative Plan: Extubation in OR  Informed Consent: I have reviewed the patients History and Physical, chart, labs and discussed the procedure including the risks, benefits and alternatives for the proposed anesthesia with the patient or authorized representative who has indicated his/her understanding and acceptance.       Plan Discussed with: CRNA and Surgeon  Anesthesia Plan Comments:         Anesthesia Quick Evaluation  

## 2017-10-02 NOTE — Op Note (Signed)
Leonard Day [id number] 1990-10-07 10/02/2017  Laparoscopic Cholecystectomy Procedure Note  Indications: This patient presents with symptomatic gallbladder disease and will undergo laparoscopic cholecystectomy.  Pre-operative Diagnosis: symptomatic cholelithiasis  Post-operative Diagnosis: chronic calculous cholecystitis  Surgeon: Gaynelle Adu MD FACS  Assistants: none  Anesthesia: General endotracheal anesthesia  Procedure Details  The patient was seen again in the Holding Room. The risks, benefits, complications, treatment options, and expected outcomes were discussed with the patient. The possibilities of reaction to medication, pulmonary aspiration, perforation of viscus, bleeding, recurrent infection, finding a normal gallbladder, the need for additional procedures, failure to diagnose a condition, the possible need to convert to an open procedure, and creating a complication requiring transfusion or operation were discussed with the patient. The likelihood of improving the patient's symptoms with return to their baseline status is good.  The patient and/or family concurred with the proposed plan, giving informed consent. The site of surgery properly noted. The patient was taken to Operating Room, identified as Leonard Day and the procedure verified as Laparoscopic Cholecystectomy with possible Intraoperative Cholangiogram. A Time Out was held and the above information confirmed. Antibiotic prophylaxis was administered.   Prior to the induction of general anesthesia, antibiotic prophylaxis was administered. General endotracheal anesthesia was then administered and tolerated well. After the induction, the abdomen was prepped with Chloraprep and draped in the sterile fashion. The patient was positioned in the supine position.  Local anesthetic agent was injected into the skin near the umbilicus and an incision made. We dissected down to the abdominal fascia with blunt dissection.  The  fascia was incised vertically and we entered the peritoneal cavity bluntly.  A pursestring suture of 0-Vicryl was placed around the fascial opening.  The Hasson cannula was inserted and secured with the stay suture.  Pneumoperitoneum was then created with CO2 and tolerated well without any adverse changes in the patient's vital signs. An 5-mm port was placed in the subxiphoid position.  Two 5-mm ports were placed in the right upper quadrant. All skin incisions were infiltrated with a local anesthetic agent before making the incision and placing the trocars.   We positioned the patient in reverse Trendelenburg, tilted slightly to the patient's left.  The gallbladder was identified, the fundus grasped and retracted cephalad.  Thin filmy omental filmy omentalAdhesions were lysed bluntly and with the electrocautery where indicated, taking care not to injure any adjacent organs or viscus. The infundibulum was grasped and retracted laterally, exposing the peritoneum overlying the triangle of Calot. This was then divided and exposed in a blunt fashion. A critical view of the cystic duct and cystic artery was obtained.  The cystic duct was clearly identified and bluntly dissected circumferentially.  However while dissecting laterally it became obvious the patient had a posterior cystic artery branch. This branch was clearly entering the gallbladder - it did not dive back down into the liver. This branch did bleed. I was able to place 2 clips on it proximally and 1 distally and then transected. Because there had been bleeding from the posterior branch of the cystic artery and the anterior branch was very close to the cystic duct, I did not want to mobilize/dissect the cystic duct anymore proximally so clips were placed just below the junction of the infundibulum and cystic duct.   The cystic duct was divided just at the beginning of the infundibulum. I did place a PDS endoloop around the cystic duct stump distal to the 2  clips. The anterior branch cystic  artery which had been identified & dissected free was ligated with clips and divided as well.   The gallbladder was dissected from the liver bed in retrograde fashion with the electrocautery. The gallbladder was removed and placed in an Ecco sac. There was some spillage of bile from the gallbladder.  The gallbladder and Ecco sac were then removed through the umbilical port site. The liver bed was irrigated and inspected. Hemostasis was achieved with the electrocautery. Copious irrigation was utilized and was repeatedly aspirated until clear.  A piece of surgical snow was placed in the GB fossa. The pursestring suture was used to close the umbilical fascia.    We again inspected the right upper quadrant for hemostasis.  The umbilical closure was inspected and there was no air leak and nothing trapped within the closure. Local was infiltrated along the right lateral abdominal wall in the preperitoneal space. Pneumoperitoneum was released as we removed the trocars.  4-0 Monocryl was used to close the skin.   Benzoin, steri-strips, and clean dressings were applied. The patient was then extubated and brought to the recovery room in stable condition. Instrument, sponge, and needle counts were correct at closure and at the conclusion of the case.   Findings: Chronic Cholecystitis with Cholelithiasis  Estimated Blood Loss: less than 100 mL         Drains: none         Specimens: Gallbladder           Complications: None; patient tolerated the procedure well.         Disposition: PACU - hemodynamically stable.         Condition: stable  Mary Sella. [name], MD, FACS General, Bariatric, & Minimally Invasive Surgery [facility], [place]

## 2017-10-02 NOTE — Anesthesia Procedure Notes (Signed)
Procedure Name: Intubation Performed by: Shya Kovatch J, CRNA Pre-anesthesia Checklist: Patient identified, Emergency Drugs available, Suction available, Patient being monitored and Timeout performed Patient Re-evaluated:Patient Re-evaluated prior to induction Oxygen Delivery Method: Circle system utilized Preoxygenation: Pre-oxygenation with 100% oxygen Induction Type: IV induction Ventilation: Mask ventilation without difficulty Laryngoscope Size: Mac and 4 Grade View: Grade I Tube type: Oral Tube size: 7.5 mm Number of attempts: 1 Airway Equipment and Method: Stylet Placement Confirmation: ETT inserted through vocal cords under direct vision,  positive ETCO2,  CO2 detector and breath sounds checked- equal and bilateral Secured at: 23 cm Tube secured with: Tape Dental Injury: Teeth and Oropharynx as per pre-operative assessment        

## 2017-10-02 NOTE — Discharge Instructions (Signed)
[facility] °LAPAROSCOPIC SURGERY: POST OP INSTRUCTIONS °Always review your discharge instruction sheet given to you by the facility where your surgery was performed. °IF YOU HAVE DISABILITY OR FAMILY LEAVE FORMS, YOU MUST BRING THEM TO THE OFFICE FOR PROCESSING.   °DO NOT GIVE THEM TO YOUR DOCTOR. ° °PAIN CONTROL ° °1. First take acetaminophen (Tylenol) AND/or ibuprofen (Advil) to control your pain after surgery.  Follow directions on package.  Taking acetaminophen (Tylenol) and/or ibuprofen (Advil) regularly after surgery will help to control your pain and lower the amount of prescription pain medication you may need.  You should not take more than 4,000 mg (4 grams) of acetaminophen (Tylenol) in 24 hours.  You should not take ibuprofen (Advil), aleve, motrin, naprosyn or other NSAIDS if you have a history of stomach ulcers or chronic kidney disease.  °2. A prescription for pain medication may be given to you upon discharge.  Take your pain medication as prescribed, if you still have uncontrolled pain after taking acetaminophen (Tylenol) or ibuprofen (Advil). °3. Use ice packs to help control pain. °4. If you need a refill on your pain medication, please contact your pharmacy.  They will contact our office to request authorization. Prescriptions will not be filled after 5pm or on week-ends. ° °HOME MEDICATIONS °5. Take your usually prescribed medications unless otherwise directed. ° °DIET °6. You should follow a light diet the first few days after arrival home.  Be sure to include lots of fluids daily. Avoid fatty, fried foods.  ° °CONSTIPATION °7. It is common to experience some constipation after surgery and if you are taking pain medication.  Increasing fluid intake and taking a stool softener (such as Colace) will usually help or prevent this problem from occurring.  A mild laxative (Milk of Magnesia or Miralax) should be taken according to package instructions if there are no bowel  movements after 48 hours. ° °WOUND/INCISION CARE °8. Most patients will experience some swelling and bruising in the area of the incisions.  Ice packs will help.  Swelling and bruising can take several days to resolve.  °9. Unless discharge instructions indicate otherwise, follow guidelines below  °a. STERI-STRIPS - you may remove your outer bandages 48 hours after surgery, and you may shower at that time.  You have steri-strips (small skin tapes) in place directly over the incision.  These strips should be left on the skin for 7-10 days.   °b. DERMABOND/SKIN GLUE - you may shower in 24 hours.  The glue will flake off over the next 2-3 weeks. °10. Any sutures or staples will be removed at the office during your follow-up visit. ° °ACTIVITIES °11. You may resume regular (light) daily activities beginning the next day--such as daily self-care, walking, climbing stairs--gradually increasing activities as tolerated.  You may have sexual intercourse when it is comfortable.  Refrain from any heavy lifting or straining until approved by your doctor. °a. You may drive when you are no longer taking prescription pain medication, you can comfortably wear a seatbelt, and you can safely maneuver your car and apply brakes. ° °FOLLOW-UP °12. You should see your doctor in the office for a follow-up appointment approximately 2-3 weeks after your surgery.  You should have been given your post-op/follow-up appointment when your surgery was scheduled.  If you did not receive a post-op/follow-up appointment, make sure that you call for this appointment within a day or two after you arrive home to insure a convenient appointment time. ° °OTHER   INSTRUCTIONS °13.  ° °WHEN TO CALL YOUR DOCTOR: °1. Fever over 101.0 °2. Inability to urinate °3. Continued bleeding from incision. °4. Increased pain, redness, or drainage from the incision. °5. Increasing abdominal pain ° °The clinic staff is available to answer your questions during regular  business hours.  Please don’t hesitate to call and ask to speak to one of the nurses for clinical concerns.  If you have a medical emergency, go to the nearest emergency room or call 911.  A surgeon from [facility] is always on call at the hospital. °[address], [address], [place], [place]  [address] ? [address] [address], [place], [place]   [address] °[phone] ? [phone] ? FAX [phone] °Web site: [email or web address] ° °

## 2017-10-02 NOTE — Interval H&P Note (Signed)
History and Physical Interval Note:  10/02/2017 2:23 PM  Leonard Day  has presented today for surgery, with the diagnosis of symptomatic cholelithiasis  The various methods of treatment have been discussed with the patient and family. After consideration of risks, benefits and other options for treatment, the patient has consented to  Procedure(s): LAPAROSCOPIC CHOLECYSTECTOMY  WITH POSSIBLE  INTRAOPERATIVE CHOLANGIOGRAM ERAS PATHWAY (N/A) as a surgical intervention .  The patient's history has been reviewed, patient examined, no change in status, stable for surgery.  I have reviewed the patient's chart and labs.  Questions were answered to the patient's satisfaction.     Gaynelle Adu
